# Patient Record
Sex: Female | Born: 1975 | Race: White | Hispanic: No | Marital: Single | Smoking: Never smoker
Health system: Southern US, Community
[De-identification: ages and names within clinical notes are randomized; demographics above are authoritative.]

## PROBLEM LIST (undated history)

## (undated) DIAGNOSIS — E119 Type 2 diabetes mellitus without complications: Secondary | ICD-10-CM

## (undated) DIAGNOSIS — R42 Dizziness and giddiness: Secondary | ICD-10-CM

## (undated) DIAGNOSIS — S060X9A Concussion with loss of consciousness of unspecified duration, initial encounter: Secondary | ICD-10-CM

## (undated) DIAGNOSIS — S060XAA Concussion with loss of consciousness status unknown, initial encounter: Secondary | ICD-10-CM

## (undated) DIAGNOSIS — M549 Dorsalgia, unspecified: Secondary | ICD-10-CM

## (undated) DIAGNOSIS — K5900 Constipation, unspecified: Secondary | ICD-10-CM

## (undated) DIAGNOSIS — F419 Anxiety disorder, unspecified: Secondary | ICD-10-CM

## (undated) DIAGNOSIS — K760 Fatty (change of) liver, not elsewhere classified: Secondary | ICD-10-CM

## (undated) DIAGNOSIS — D649 Anemia, unspecified: Secondary | ICD-10-CM

## (undated) DIAGNOSIS — G4730 Sleep apnea, unspecified: Secondary | ICD-10-CM

## (undated) DIAGNOSIS — F32A Depression, unspecified: Secondary | ICD-10-CM

## (undated) DIAGNOSIS — Z6841 Body Mass Index (BMI) 40.0 and over, adult: Secondary | ICD-10-CM

## (undated) DIAGNOSIS — R002 Palpitations: Secondary | ICD-10-CM

## (undated) DIAGNOSIS — E7800 Pure hypercholesterolemia, unspecified: Secondary | ICD-10-CM

## (undated) DIAGNOSIS — G4700 Insomnia, unspecified: Secondary | ICD-10-CM

## (undated) HISTORY — DX: Dizziness and giddiness: R42

## (undated) HISTORY — DX: Constipation, unspecified: K59.00

## (undated) HISTORY — DX: Concussion with loss of consciousness of unspecified duration, initial encounter: S06.0X9A

## (undated) HISTORY — PX: COLONOSCOPY: SHX174

## (undated) HISTORY — DX: Anemia, unspecified: D64.9

## (undated) HISTORY — DX: Anxiety disorder, unspecified: F41.9

## (undated) HISTORY — DX: Insomnia, unspecified: G47.00

## (undated) HISTORY — DX: Depression, unspecified: F32.A

## (undated) HISTORY — DX: Type 2 diabetes mellitus without complications: E11.9

## (undated) HISTORY — DX: Sleep apnea, unspecified: G47.30

## (undated) HISTORY — DX: Fatty (change of) liver, not elsewhere classified: K76.0

## (undated) HISTORY — DX: Morbid (severe) obesity due to excess calories: E66.01

## (undated) HISTORY — DX: Palpitations: R00.2

## (undated) HISTORY — DX: Concussion with loss of consciousness status unknown, initial encounter: S06.0XAA

## (undated) HISTORY — DX: Pure hypercholesterolemia, unspecified: E78.00

## (undated) HISTORY — DX: Dorsalgia, unspecified: M54.9

## (undated) HISTORY — DX: Body Mass Index (BMI) 40.0 and over, adult: Z684

---

## 2002-09-25 HISTORY — PX: TUBAL LIGATION: SHX77

## 2003-06-09 ENCOUNTER — Emergency Department (HOSPITAL_COMMUNITY): Admission: EM | Admit: 2003-06-09 | Discharge: 2003-06-09 | Payer: Self-pay | Admitting: Emergency Medicine

## 2003-08-17 ENCOUNTER — Emergency Department (HOSPITAL_COMMUNITY): Admission: EM | Admit: 2003-08-17 | Discharge: 2003-08-17 | Payer: Self-pay | Admitting: Emergency Medicine

## 2004-04-04 ENCOUNTER — Emergency Department (HOSPITAL_COMMUNITY): Admission: EM | Admit: 2004-04-04 | Discharge: 2004-04-05 | Payer: Self-pay | Admitting: Emergency Medicine

## 2011-07-20 ENCOUNTER — Encounter: Payer: Self-pay | Admitting: *Deleted

## 2011-07-20 ENCOUNTER — Emergency Department (HOSPITAL_COMMUNITY)
Admission: EM | Admit: 2011-07-20 | Discharge: 2011-07-20 | Disposition: A | Discharge status: Home / Self Care | Payer: Self-pay | Attending: Emergency Medicine | Admitting: Emergency Medicine

## 2011-07-20 DIAGNOSIS — R110 Nausea: Secondary | ICD-10-CM | POA: Insufficient documentation

## 2011-07-20 DIAGNOSIS — R109 Unspecified abdominal pain: Secondary | ICD-10-CM | POA: Insufficient documentation

## 2011-07-20 DIAGNOSIS — M549 Dorsalgia, unspecified: Secondary | ICD-10-CM | POA: Insufficient documentation

## 2011-07-20 DIAGNOSIS — N12 Tubulo-interstitial nephritis, not specified as acute or chronic: Secondary | ICD-10-CM | POA: Insufficient documentation

## 2011-07-20 LAB — URINALYSIS, ROUTINE W REFLEX MICROSCOPIC
Bilirubin Urine: NEGATIVE
Glucose, UA: NEGATIVE mg/dL
Ketones, ur: NEGATIVE mg/dL
Nitrite: NEGATIVE
Protein, ur: NEGATIVE mg/dL
Specific Gravity, Urine: 1.02 (ref 1.005–1.030)
Urobilinogen, UA: 0.2 mg/dL (ref 0.0–1.0)
pH: 6 (ref 5.0–8.0)

## 2011-07-20 LAB — PREGNANCY, URINE: Preg Test, Ur: NEGATIVE

## 2011-07-20 LAB — URINE MICROSCOPIC-ADD ON

## 2011-07-20 MED ORDER — CIPROFLOXACIN HCL 250 MG PO TABS
500.0000 mg | ORAL_TABLET | Freq: Once | ORAL | Status: AC
  Administered 2011-07-20: 500 mg via ORAL
  Filled 2011-07-20: qty 2

## 2011-07-20 MED ORDER — TRAMADOL HCL 50 MG PO TABS: 50.0000 mg | ORAL_TABLET | Freq: Four times a day (QID) | ORAL | Status: AC | PRN

## 2011-07-20 MED ORDER — CIPROFLOXACIN HCL 500 MG PO TABS: 500.0000 mg | ORAL_TABLET | Freq: Two times a day (BID) | ORAL | Status: AC

## 2011-07-20 MED ORDER — KETOROLAC TROMETHAMINE 60 MG/2ML IM SOLN
60.0000 mg | Freq: Once | INTRAMUSCULAR | Status: AC
  Administered 2011-07-20: 60 mg via INTRAMUSCULAR
  Filled 2011-07-20: qty 2

## 2011-07-20 NOTE — ED Provider Notes (Signed)
History     CSN: [id number] Arrival date & time: 07/20/2011  3:02 AM   First MD Initiated Contact with Patient 07/20/11 [phone]      Chief Complaint  Patient presents with  . Flank Pain    (Consider location/radiation/quality/duration/timing/severity/associated sxs/prior treatment) HPI Comments: This pleasant 35 year old female with no significant medical history on no medications who presents with left-sided lower back and flank pain which was gradual in onset 1.5 days ago. This has been constant, gradually getting worse, associated with nausea but no abdominal pain fevers dysuria hematuria frequency urination or diarrhea or constipation. She does not get urinary tract infections frequently and has never had a kidney stone.  Her pain feels achy, sharp and stabbing, does not radiate to her abdomen or her genitalia.  She denies that there is movement related  component of her pain stating that it hurts whether she is standing sitting or lying down.  Patient is a 35 y.o. female presenting with flank pain. The history is provided by the patient.  Flank Pain Pertinent negatives include no chest pain and no abdominal pain.    History reviewed. No pertinent past medical history.  Past Surgical History  Procedure Date  . Tubal ligation     No family history on file.  History  Substance Use Topics  . Smoking status: Never Smoker   . Smokeless tobacco: Not on file  . Alcohol Use: No    OB History    Grav Para Term Preterm Abortions TAB SAB Ect Mult Living                  Review of Systems  Constitutional: Negative for fever and chills.  HENT: Negative for neck pain.   Respiratory: Negative for cough.   Cardiovascular: Negative for chest pain and leg swelling.  Gastrointestinal: Positive for nausea. Negative for vomiting, abdominal pain, diarrhea and constipation.  Genitourinary: Positive for flank pain.  Musculoskeletal: Positive for back pain.  Skin: Negative for rash.    Neurological: Negative for weakness and numbness.  Hematological: Negative for adenopathy.    Allergies  Review of patient's allergies indicates no known allergies.  Home Medications   Current Outpatient Rx  Name Route Sig Dispense Refill  . CIPROFLOXACIN HCL 500 MG PO TABS Oral Take 1 tablet (500 mg total) by mouth every 12 (twelve) hours. 20 tablet 0  . TRAMADOL HCL 50 MG PO TABS Oral Take 1 tablet (50 mg total) by mouth every 6 (six) hours as needed for pain. Maximum dose= 8 tablets per day 15 tablet 0    BP 125/64  Pulse 63  Temp(Src) 97.7 F (36.5 C) (Oral)  Resp 16  Ht 5\' 2"  (1.575 m)  Wt 291 lb (131.997 kg)  BMI 53.22 kg/m2  SpO2 99%  LMP 06/20/2011  Physical Exam  Nursing note and vitals reviewed. Constitutional: She appears well-developed and well-nourished.       Mild discomfort  HENT:  Head: Normocephalic and atraumatic.  Mouth/Throat: Oropharynx is clear and moist. No oropharyngeal exudate.  Eyes: EOM are normal. Right eye exhibits no discharge. Left eye exhibits no discharge. No scleral icterus.  Neck: No JVD present.  Cardiovascular: Normal rate, regular rhythm, normal heart sounds and intact distal pulses.  Exam reveals no gallop and no friction rub.   No murmur heard. Pulmonary/Chest: Effort normal and breath sounds normal. No respiratory distress. She has no wheezes. She has no rales.  Abdominal: Soft. Bowel sounds are normal. She exhibits no distension and  no mass. There is tenderness (Of suprapubic and left lower quadrant tenderness to palpation). There is CVA tenderness.       While the left CVA tenderness  Musculoskeletal: Normal range of motion. She exhibits no edema and no tenderness.  Neurological: She is alert. Coordination normal.  Skin: Skin is warm and dry. No rash noted. No erythema.  Psychiatric: She has a normal mood and affect. Her behavior is normal.    ED Course  Procedures (including critical care time)  Labs Reviewed  URINALYSIS,  ROUTINE W REFLEX MICROSCOPIC - Abnormal; Notable for the following:    Color, Urine STRAW (*)    Appearance HAZY (*)    Hgb urine dipstick LARGE (*)    Leukocytes, UA SMALL (*)    All other components within normal limits  URINE MICROSCOPIC-ADD ON - Abnormal; Notable for the following:    Bacteria, UA FEW (*)    All other components within normal limits  PREGNANCY, URINE  URINE CULTURE   No results found.   1. Pyelonephritis       MDM  Mild suprapubic and left lower quadrant tenderness to palpation. We'll send urinalysis to rule out urinary tract infection. Intramuscular Toradol ordered    Results for orders placed during the hospital encounter of 07/20/11  URINALYSIS, ROUTINE W REFLEX MICROSCOPIC      Component Value Range   Color, Urine STRAW (*) YELLOW    Appearance HAZY (*) CLEAR    Specific Gravity, Urine 1.020  1.005 - 1.030    pH 6.0  5.0 - 8.0    Glucose, UA NEGATIVE  NEGATIVE (mg/dL)   Hgb urine dipstick LARGE (*) NEGATIVE    Bilirubin Urine NEGATIVE  NEGATIVE    Ketones, ur NEGATIVE  NEGATIVE (mg/dL)   Protein, ur NEGATIVE  NEGATIVE (mg/dL)   Urobilinogen, UA 0.2  0.0 - 1.0 (mg/dL)   Nitrite NEGATIVE  NEGATIVE    Leukocytes, UA SMALL (*) NEGATIVE   PREGNANCY, URINE      Component Value Range   Preg Test, Ur NEGATIVE    URINE MICROSCOPIC-ADD ON      Component Value Range   Squamous Epithelial / LPF RARE  RARE    WBC, UA 11-20  <3 (WBC/hpf)   RBC / HPF 21-50  <3 (RBC/hpf)   Bacteria, UA FEW (*) RARE    No results found.  Urinalysis consistent with urinary infection. Given this clinical scenario with left lower back pain this suggests a early pyelonephritis. She is afebrile, normal pulse, normal blood pressure. There is no indication for invasive therapies at this time. Antibiotics given in the emergency department.  I have discussed her findings and treatment plan with her at length and she is expressed her understanding.  Vida Roller, MD 07/20/11  [phone]

## 2011-07-20 NOTE — ED Notes (Signed)
Left sided flank pain x 2 days with nausea.  Denies GU sx.

## 2011-07-21 LAB — URINE CULTURE
Colony Count: NO GROWTH
Culture  Setup Time: 201210251801
Culture: NO GROWTH

## 2012-10-02 ENCOUNTER — Ambulatory Visit: Payer: Self-pay | Admitting: Family Medicine

## 2013-10-22 ENCOUNTER — Ambulatory Visit (INDEPENDENT_AMBULATORY_CARE_PROVIDER_SITE_OTHER): Payer: 59 | Admitting: Women's Health

## 2013-10-22 ENCOUNTER — Encounter: Payer: Self-pay | Admitting: Women's Health

## 2013-10-22 ENCOUNTER — Encounter (INDEPENDENT_AMBULATORY_CARE_PROVIDER_SITE_OTHER): Payer: Self-pay

## 2013-10-22 ENCOUNTER — Other Ambulatory Visit (HOSPITAL_COMMUNITY)
Admission: RE | Admit: 2013-10-22 | Discharge: 2013-10-22 | Disposition: A | Discharge status: Home / Self Care | Payer: 59 | Source: Ambulatory Visit | Attending: Obstetrics & Gynecology | Admitting: Obstetrics & Gynecology

## 2013-10-22 VITALS — BP 100/60 | Ht 62.0 in | Wt 304.0 lb

## 2013-10-22 DIAGNOSIS — Z113 Encounter for screening for infections with a predominantly sexual mode of transmission: Secondary | ICD-10-CM

## 2013-10-22 DIAGNOSIS — F419 Anxiety disorder, unspecified: Secondary | ICD-10-CM | POA: Insufficient documentation

## 2013-10-22 DIAGNOSIS — Z01419 Encounter for gynecological examination (general) (routine) without abnormal findings: Secondary | ICD-10-CM

## 2013-10-22 DIAGNOSIS — G4730 Sleep apnea, unspecified: Secondary | ICD-10-CM | POA: Insufficient documentation

## 2013-10-22 DIAGNOSIS — Z124 Encounter for screening for malignant neoplasm of cervix: Secondary | ICD-10-CM | POA: Insufficient documentation

## 2013-10-22 DIAGNOSIS — R87810 Cervical high risk human papillomavirus (HPV) DNA test positive: Secondary | ICD-10-CM | POA: Insufficient documentation

## 2013-10-22 DIAGNOSIS — Z6841 Body Mass Index (BMI) 40.0 and over, adult: Secondary | ICD-10-CM

## 2013-10-22 DIAGNOSIS — Z1151 Encounter for screening for human papillomavirus (HPV): Secondary | ICD-10-CM | POA: Insufficient documentation

## 2013-10-22 HISTORY — DX: Morbid (severe) obesity due to excess calories: E66.01

## 2013-10-22 LAB — RPR

## 2013-10-22 LAB — HIV ANTIBODY (ROUTINE TESTING W REFLEX): HIV: NONREACTIVE

## 2013-10-22 LAB — HEPATITIS B SURFACE ANTIGEN: Hepatitis B Surface Ag: NEGATIVE

## 2013-10-22 NOTE — Progress Notes (Addendum)
Patient ID: Ana Walker, female   DOB: 25-Mar-1976, 38 y.o.   MRN: [id number] Subjective:     Ana KinKimberly A Walker is a 38 y.o. 682P1011 Caucasian  female here for a routine well-woman exam.  Patient's last menstrual period was 10/07/2013. Started weight watchers program through work in Dec, has lost 13lbs already! [employer] [employer]  Current complaints: none Smoking Status: never Does desire STI screening. Had partner who cheated. Her PCP in [place] does her annual labs, just had all in Sept/Oct and were normal, including A1C and lipid panel.  The following portions of the patient's history were reviewed and updated as appropriate: allergies, current medications, past family history, past medical history, past social history, past surgical history and problem list.  Medical History Anxiety- currently on Pristiq 50mg  daily managed by PCP Sleep apnea- uses CPAP  Gynecologic History Patient's last menstrual period was 10/07/2013. Contraception: tubal ligation Last Pap: unsure. Results were: normal Last mammogram: 1-9012yrs ago. Results were: normal. States she had a lump at that time but it has gone away.   Obstetric History OB History  No data available  G2P1011, term SVD 38yo ago Had A1DM w/ pregnancy  Review of Systems Patient denies any headaches, blurred vision, shortness of breath, chest pain, abdominal pain, problems with bowel movements, urination, or intercourse.    Objective:     Physical Exam  BP 100/60  Ht 5\' 2"  (1.575 m)  Wt 304 lb (137.893 kg)  BMI 55.59 kg/m2  LMP 10/07/2013  General:  Well developed, well nourished, no acute distress. She is alert and oriented x 3. Skin:  Warm and dry Neck:  Midline trachea, no thyromegaly or nodules Cardiovascular: Regular rate and rhythm, no murmur heard Lungs:  Effort normal, all lung fields clear to auscultation bilaterally Breast:  No dominant palpable mass, retraction, or nipple discharge Abdomen:   Soft, non tender, no hepatosplenomegaly or masses Pelvic:  External genitalia is normal in appearance.  The vagina is normal in appearance.  The cervix is anterior, bulbous, no CMT.    Thin prep pap is done w/ HR HPV cotesting. Uterus is felt to be normal size, shape, and contour.  No adnexal masses or   tenderness noted. Extremities:  No swelling or varicosities noted Psych:  She has a normal mood and affect  Assessment:     Healthy well-woman exam Morbid obesity BMI 55.59 STI screening Anxiety Sleep apnea     Plan:  Continue weight watchers/weight loss program HIV, RPR, Hep B&C, HSV2, GC/CH from pap today F/U 6965yr for physical, or sooner prn problems Mammogram @ 40yo or sooner if problems Colonoscopy @ 40yo or sooner if problems   Marge DuncansBooker, Perry Randall CNM, [facility]NP-BC 10/22/2013 9:14 AM

## 2013-10-23 LAB — HSV 2 ANTIBODY, IGG: HSV 2 Glycoprotein G Ab, IgG: 0.11 IV

## 2013-11-12 ENCOUNTER — Ambulatory Visit: Payer: Self-pay | Admitting: Family Medicine

## 2014-02-24 ENCOUNTER — Emergency Department (HOSPITAL_COMMUNITY)
Admission: EM | Admit: 2014-02-24 | Discharge: 2014-02-24 | Disposition: A | Discharge status: Home / Self Care | Payer: 59 | Attending: Emergency Medicine | Admitting: Emergency Medicine

## 2014-02-24 ENCOUNTER — Encounter (HOSPITAL_COMMUNITY): Payer: Self-pay | Admitting: Emergency Medicine

## 2014-02-24 DIAGNOSIS — Y929 Unspecified place or not applicable: Secondary | ICD-10-CM | POA: Insufficient documentation

## 2014-02-24 DIAGNOSIS — T148XXA Other injury of unspecified body region, initial encounter: Secondary | ICD-10-CM

## 2014-02-24 DIAGNOSIS — IMO0002 Reserved for concepts with insufficient information to code with codable children: Secondary | ICD-10-CM | POA: Insufficient documentation

## 2014-02-24 DIAGNOSIS — G4730 Sleep apnea, unspecified: Secondary | ICD-10-CM | POA: Insufficient documentation

## 2014-02-24 DIAGNOSIS — X58XXXA Exposure to other specified factors, initial encounter: Secondary | ICD-10-CM | POA: Insufficient documentation

## 2014-02-24 DIAGNOSIS — F411 Generalized anxiety disorder: Secondary | ICD-10-CM | POA: Insufficient documentation

## 2014-02-24 DIAGNOSIS — Z4889 Encounter for other specified surgical aftercare: Secondary | ICD-10-CM | POA: Insufficient documentation

## 2014-02-24 DIAGNOSIS — Z9981 Dependence on supplemental oxygen: Secondary | ICD-10-CM | POA: Insufficient documentation

## 2014-02-24 DIAGNOSIS — Z79899 Other long term (current) drug therapy: Secondary | ICD-10-CM | POA: Insufficient documentation

## 2014-02-24 DIAGNOSIS — Y9389 Activity, other specified: Secondary | ICD-10-CM | POA: Insufficient documentation

## 2014-02-24 MED ORDER — TRAMADOL HCL 50 MG PO TABS: 50.0000 mg | ORAL_TABLET | Freq: Four times a day (QID) | ORAL | Status: DC | PRN

## 2014-02-24 MED ORDER — CYCLOBENZAPRINE HCL 5 MG PO TABS: 5.0000 mg | ORAL_TABLET | Freq: Two times a day (BID) | ORAL | Status: DC | PRN

## 2014-02-24 NOTE — Discharge Instructions (Signed)
Muscle Strain  A muscle strain (pulled muscle) happens when a muscle is stretched beyond normal length. It happens when a sudden, violent force stretches your muscle too far. Usually, a few of the fibers in your muscle are torn. Muscle strain is common in athletes. Recovery usually takes 1 2 weeks. Complete healing takes 5 6 weeks.   HOME CARE    Follow the PRICE method of treatment to help your injury get better. Do this the first 2 3 days after the injury:   Protect. Protect the muscle to keep it from getting injured again.   Rest. Limit your activity and rest the injured body part.   Ice. Put ice in a plastic bag. Place a towel between your skin and the bag. Then, apply the ice and leave it on from 15 20 minutes each hour. After the third day, switch to moist heat packs.   Compression. Use a splint or elastic bandage on the injured area for comfort. Do not put it on too tightly.   Elevate. Keep the injured body part above the level of your heart.   Only take medicine as told by your doctor.   Warm up before doing exercise to prevent future muscle strains.  GET HELP IF:    You have more pain or puffiness (swelling) in the injured area.   You feel numbness, tingling, or notice a loss of strength in the injured area.  MAKE SURE YOU:    Understand these instructions.   Will watch your condition.   Will get help right away if you are not doing well or get worse.  Document Released: 06/20/2008 Document Revised: 07/02/2013 Document Reviewed: 04/10/2013  [employer] Patient Information 2014 [employer].

## 2014-02-24 NOTE — ED Notes (Signed)
Rt thigh pain, onset Saturday , after pushing a tv on rollers.

## 2014-02-24 NOTE — ED Provider Notes (Signed)
Medical screening examination/treatment/procedure(s) were performed by non-physician practitioner and as supervising physician I was immediately available for consultation/collaboration.   EKG Interpretation None       Honor Fairbank R. Jameson Morrow, MD 02/24/14 1528 

## 2014-02-24 NOTE — ED Provider Notes (Signed)
CSN: [id number]     Arrival date & time 02/24/14  1403 History   First MD Initiated Contact with Patient 02/24/14 1410     Chief Complaint  Patient presents with  . Leg Pain     (Consider location/radiation/quality/duration/timing/severity/associated sxs/prior Treatment) HPI Comments: Pt states that she moved a old large tv on rollers and her back started hurting the next day. The pain started 4 days ago. The pain radiates down her right leg. Denies numbness, weakness or dysuria. States that she has taking ibuprofen with some mild relief. No history of back pain  The history is provided by the patient. No language interpreter was used.    Past Medical History  Diagnosis Date  . Sleep apnea   . Sleep apnea     uses cpap  . Anxiety     on pristiq 50mg  daily  . Morbid obesity with BMI of 50.0-59.9, adult 10/22/2013  . Anxiety    Past Surgical History  Procedure Laterality Date  . Tubal ligation     History reviewed. No pertinent family history. History  Substance Use Topics  . Smoking status: Never Smoker   . Smokeless tobacco: Not on file  . Alcohol Use: No   OB History   Grav Para Term Preterm Abortions TAB SAB Ect Mult Living                 Review of Systems  Constitutional: Negative.   Respiratory: Negative.   Cardiovascular: Negative.       Allergies  Review of patient's allergies indicates no known allergies.  Home Medications   Prior to Admission medications   Medication Sig Start Date End Date Taking? Authorizing Provider  desvenlafaxine (PRISTIQ) 50 MG 24 hr tablet Take 50 mg by mouth daily.    Historical Provider, MD   BP 130/75  Pulse 80  Temp(Src) 98.4 F (36.9 C) (Oral)  Resp 18  Ht 5\' 2"  (1.575 m)  Wt 305 lb (138.347 kg)  BMI 55.77 kg/m2  SpO2 98%  LMP 02/03/2014 Physical Exam  Nursing note and vitals reviewed. Constitutional: She is oriented to person, place, and time. She appears well-developed and well-nourished.  Cardiovascular:  Normal rate and regular rhythm.   Pulmonary/Chest: Effort normal and breath sounds normal.  Musculoskeletal:  Lumbar paraspinal tenderness. Pt has rull rom of the right hip. No redness or warmth noted  Neurological: She is alert and oriented to person, place, and time. Coordination normal.  Skin: Skin is warm and dry.  Psychiatric: She has a normal mood and affect.    ED Course  Procedures (including critical care time) Labs Review Labs Reviewed - No data to display  Imaging Review No results found.   EKG Interpretation None      MDM   Final diagnoses:  Muscle strain    Pt is neurovascularly intact. Likely strain. Don't think imaging is needed at this time will treat symptomatically with ultram and flexeril    Teressa Lower, NP 02/24/14 1428

## 2014-07-10 ENCOUNTER — Other Ambulatory Visit: Payer: Self-pay

## 2014-09-26 ENCOUNTER — Emergency Department (HOSPITAL_COMMUNITY): Payer: 59

## 2014-09-26 ENCOUNTER — Encounter (HOSPITAL_COMMUNITY): Payer: Self-pay

## 2014-09-26 ENCOUNTER — Emergency Department (HOSPITAL_COMMUNITY)
Admission: EM | Admit: 2014-09-26 | Discharge: 2014-09-26 | Disposition: A | Discharge status: Home / Self Care | Payer: 59 | Attending: Emergency Medicine | Admitting: Emergency Medicine

## 2014-09-26 DIAGNOSIS — Z792 Long term (current) use of antibiotics: Secondary | ICD-10-CM | POA: Insufficient documentation

## 2014-09-26 DIAGNOSIS — J209 Acute bronchitis, unspecified: Secondary | ICD-10-CM | POA: Insufficient documentation

## 2014-09-26 DIAGNOSIS — Z9981 Dependence on supplemental oxygen: Secondary | ICD-10-CM | POA: Insufficient documentation

## 2014-09-26 DIAGNOSIS — Z79899 Other long term (current) drug therapy: Secondary | ICD-10-CM | POA: Insufficient documentation

## 2014-09-26 DIAGNOSIS — G4730 Sleep apnea, unspecified: Secondary | ICD-10-CM | POA: Insufficient documentation

## 2014-09-26 DIAGNOSIS — R059 Cough, unspecified: Secondary | ICD-10-CM

## 2014-09-26 DIAGNOSIS — R05 Cough: Secondary | ICD-10-CM

## 2014-09-26 DIAGNOSIS — F419 Anxiety disorder, unspecified: Secondary | ICD-10-CM | POA: Insufficient documentation

## 2014-09-26 MED ORDER — AZITHROMYCIN 250 MG PO TABS
500.0000 mg | ORAL_TABLET | Freq: Once | ORAL | Status: AC
  Administered 2014-09-26: 500 mg via ORAL
  Filled 2014-09-26: qty 2

## 2014-09-26 MED ORDER — AZITHROMYCIN 250 MG PO TABS: ORAL_TABLET | ORAL | Status: DC

## 2014-09-26 MED ORDER — BENZONATATE 100 MG PO CAPS: 200.0000 mg | ORAL_CAPSULE | Freq: Three times a day (TID) | ORAL | Status: DC

## 2014-09-26 MED ORDER — ALBUTEROL SULFATE HFA 108 (90 BASE) MCG/ACT IN AERS
2.0000 | INHALATION_SPRAY | RESPIRATORY_TRACT | Status: DC | PRN
  Administered 2014-09-26: 2 via RESPIRATORY_TRACT
  Filled 2014-09-26: qty 6.7

## 2014-09-26 NOTE — ED Provider Notes (Signed)
CSN: [id number]     Arrival date & time 09/26/14  1123 History   First MD Initiated Contact with Patient 09/26/14 1336     Chief Complaint  Patient presents with  . Cough     (Consider location/radiation/quality/duration/timing/severity/associated sxs/prior Treatment) The history is provided by the patient.   Ana Walker is a 39 y.o. female presenting with a one week history of uri type symptoms which includes nasal congestion with clear rhinorrhea, cough which is occasionally productive of a thick sputum (pt has not looked, cannot confirm color or lack of blood), along with wheezing and shortness of breath with wheezing episodes.  She denies fevers, chills, chest pain.  She does have sleep apnea, currently using cpap machine nightly and uses singulair daily.  She does not have a history of asthma.   She denies nausea, vomiting or diarrhea.  The patient has taken delsym, dayquil, nyquill with transient relief of symptoms.     Past Medical History  Diagnosis Date  . Sleep apnea   . Sleep apnea     uses cpap  . Anxiety     on pristiq  daily  . Morbid obesity with BMI of 50.0-59.9, adult 10/22/2013  . Anxiety    Past Surgical History  Procedure Laterality Date  . Tubal ligation     No family history on file. History  Substance Use Topics  . Smoking status: Never Smoker   . Smokeless tobacco: Not on file  . Alcohol Use: No   OB History    No data available     Review of Systems  Constitutional: Negative for fever and chills.  HENT: Positive for congestion and rhinorrhea. Negative for ear pain, sinus pressure, sore throat, trouble swallowing and voice change.   Eyes: Negative for discharge.  Respiratory: Positive for cough and wheezing. Negative for shortness of breath and stridor.   Cardiovascular: Negative for chest pain.  Gastrointestinal: Negative for abdominal pain.  Genitourinary: Negative.       Allergies  Review of patient's allergies indicates no  known allergies.  Home Medications   Prior to Admission medications   Medication Sig Start Date End Date Taking? Authorizing Provider  ALPRAZolam Prudy Feeler) 0.5 MG tablet Take 0.5 mg by mouth daily as needed for anxiety.   Yes Historical Provider, MD  ferrous sulfate 325 (65 FE) MG tablet Take 325 mg by mouth daily with breakfast.   Yes Historical Provider, MD  FLUoxetine (PROZAC) 40 MG capsule Take 40 mg by mouth daily.   Yes Historical Provider, MD  montelukast (SINGULAIR) 10 MG tablet Take 10 mg by mouth daily.   Yes Historical Provider, MD  azithromycin (ZITHROMAX) 250 MG tablet One tablet daily x 4 days 09/26/14   Burgess Amor, PA-C  benzonatate (TESSALON) 100 MG capsule Take 2 capsules (200 mg total) by mouth every 8 (eight) hours. 09/26/14   Burgess Amor, PA-C  cyclobenzaprine (FLEXERIL) 5 MG tablet Take 1 tablet (5 mg total) by mouth 2 (two) times daily as needed for muscle spasms. Patient not taking: Reported on 09/26/2014 02/24/14   Teressa Lower, NP  traMADol (ULTRAM) 50 MG tablet Take 1 tablet (50 mg total) by mouth every 6 (six) hours as needed. Patient not taking: Reported on 09/26/2014 02/24/14   Teressa Lower, NP   BP 125/69 mmHg  Pulse 67  Temp(Src) 98.1 F (36.7 C) (Oral)  Resp 16  Ht  (1.575 m)  Wt 310 lb (140.615 kg)  BMI 56.69 kg/m2  SpO2 100%  LMP 09/14/2014 Physical Exam  Constitutional: She appears well-developed and well-nourished.  HENT:  Head: Normocephalic and atraumatic.  Eyes: Conjunctivae are normal.  Neck: Normal range of motion.  Cardiovascular: Normal rate, regular rhythm, normal heart sounds and intact distal pulses.   Pulmonary/Chest: Effort normal and breath sounds normal. No respiratory distress. She has no wheezes. She has no rales. She exhibits no tenderness.  occasional dry cough during exam.  No wheezing on exam  Abdominal: Soft. Bowel sounds are normal. There is no tenderness.  Musculoskeletal: Normal range of motion.  Neurological: She is  alert.  Skin: Skin is warm and dry.  Psychiatric: She has a normal mood and affect.  Nursing note and vitals reviewed.   ED Course  Procedures (including critical care time) Labs Review Labs Reviewed - No data to display  Imaging Review Dg Chest 2 View  09/26/2014   CLINICAL DATA:  Cough, congestion and shortness of breath.  EXAM: CHEST - 2 VIEW  COMPARISON:  07/20/2006 film at [facility]  FINDINGS: The heart size and mediastinal contours are within normal limits. Mild atelectasis present at both lung bases. There is no evidence of pulmonary edema, consolidation, pneumothorax, nodule or pleural fluid. The visualized skeletal structures are unremarkable.  IMPRESSION: No active disease.   Electronically Signed   By: Irish Lack M.D.   On: 09/26/2014 12:57     EKG Interpretation None      MDM   Final diagnoses:  Acute bronchitis, unspecified organism    Patients labs and/or radiological studies were viewed and considered during the medical decision making and disposition process. Chest x-ray is unremarkable, however given patient's history of sleep apnea will cover her with antibiotics, Z-Pak prescribed.  She was also prescribed Tessalon pearls for cough, given albuterol HFA for every 4 hours when necessary use.  Advised increase fluid intake, rest, follow-up with PCP or return here for any worsening symptoms.  She is not in any respiratory distress at this time.  The patient appears reasonably screened and/or stabilized for discharge and I doubt any other medical condition or other [facility] requiring further screening, evaluation, or treatment in the ED at this time prior to discharge.     Burgess Amor, PA-C 09/26/14 1436  Doug Sou, MD 09/26/14 1558

## 2014-09-26 NOTE — Discharge Instructions (Signed)
Acute Bronchitis Bronchitis is inflammation of the airways that extend from the windpipe into the lungs (bronchi). The inflammation often causes mucus to develop. This leads to a cough, which is the most common symptom of bronchitis.  In acute bronchitis, the condition usually develops suddenly and goes away over time, usually in a couple weeks. Smoking, allergies, and asthma can make bronchitis worse. Repeated episodes of bronchitis may cause further lung problems.  CAUSES Acute bronchitis is most often caused by the same virus that causes a cold. The virus can spread from person to person (contagious) through coughing, sneezing, and touching contaminated objects. SIGNS AND SYMPTOMS   Cough.   Fever.   Coughing up mucus.   Body aches.   Chest congestion.   Chills.   Shortness of breath.   Sore throat.  DIAGNOSIS  Acute bronchitis is usually diagnosed through a physical exam. Your health care provider will also ask you questions about your medical history. Tests, such as chest X-rays, are sometimes done to rule out other conditions.  TREATMENT  Acute bronchitis usually goes away in a couple weeks. Oftentimes, no medical treatment is necessary. Medicines are sometimes given for relief of fever or cough. Antibiotic medicines are usually not needed but may be prescribed in certain situations. In some cases, an inhaler may be recommended to help reduce shortness of breath and control the cough. A cool mist vaporizer may also be used to help thin bronchial secretions and make it easier to clear the chest.  HOME CARE INSTRUCTIONS  Get plenty of rest.   Drink enough fluids to keep your urine clear or pale yellow (unless you have a medical condition that requires fluid restriction). Increasing fluids may help thin your respiratory secretions (sputum) and reduce chest congestion, and it will prevent dehydration.   Take medicines only as directed by your health care provider.  If  you were prescribed an antibiotic medicine, finish it all even if you start to feel better.  Avoid smoking and secondhand smoke. Exposure to cigarette smoke or irritating chemicals will make bronchitis worse. If you are a smoker, consider using nicotine gum or skin patches to help control withdrawal symptoms. Quitting smoking will help your lungs heal faster.   Reduce the chances of another bout of acute bronchitis by washing your hands frequently, avoiding people with cold symptoms, and trying not to touch your hands to your mouth, nose, or eyes.   Keep all follow-up visits as directed by your health care provider.  SEEK MEDICAL CARE IF: Your symptoms do not improve after 1 week of treatment.  SEEK IMMEDIATE MEDICAL CARE IF:  You develop an increased fever or chills.   You have chest pain.   You have severe shortness of breath.  You have bloody sputum.   You develop dehydration.  You faint or repeatedly feel like you are going to pass out.  You develop repeated vomiting.  You develop a severe headache. MAKE SURE YOU:   Understand these instructions.  Will watch your condition.  Will get help right away if you are not doing well or get worse. Document Released: 10/19/2004 Document Revised: 01/26/2014 Document Reviewed: 03/04/2013 [facility] Patient Information 2015 [place], [place]. This information is not intended to replace advice given to you by your health care provider. Make sure you discuss any questions you have with your health care provider.   Take your next dose of Zithromax tomorrow.  Use the inhaler as instructed.  Rest and make sure you are drinking plenty  of fluids.

## 2014-09-26 NOTE — ED Notes (Signed)
Complain of cough and congestion 

## 2014-12-15 ENCOUNTER — Encounter (HOSPITAL_COMMUNITY): Payer: Self-pay | Admitting: *Deleted

## 2014-12-15 ENCOUNTER — Emergency Department (HOSPITAL_COMMUNITY): Payer: Self-pay

## 2014-12-15 ENCOUNTER — Emergency Department (HOSPITAL_COMMUNITY)
Admission: EM | Admit: 2014-12-15 | Discharge: 2014-12-15 | Disposition: A | Discharge status: Home / Self Care | Payer: Self-pay | Attending: Emergency Medicine | Admitting: Emergency Medicine

## 2014-12-15 DIAGNOSIS — Y9389 Activity, other specified: Secondary | ICD-10-CM | POA: Insufficient documentation

## 2014-12-15 DIAGNOSIS — Y9289 Other specified places as the place of occurrence of the external cause: Secondary | ICD-10-CM | POA: Insufficient documentation

## 2014-12-15 DIAGNOSIS — S300XXA Contusion of lower back and pelvis, initial encounter: Secondary | ICD-10-CM

## 2014-12-15 DIAGNOSIS — Z79899 Other long term (current) drug therapy: Secondary | ICD-10-CM | POA: Insufficient documentation

## 2014-12-15 DIAGNOSIS — Y998 Other external cause status: Secondary | ICD-10-CM | POA: Insufficient documentation

## 2014-12-15 DIAGNOSIS — W108XXA Fall (on) (from) other stairs and steps, initial encounter: Secondary | ICD-10-CM | POA: Insufficient documentation

## 2014-12-15 DIAGNOSIS — G4730 Sleep apnea, unspecified: Secondary | ICD-10-CM | POA: Insufficient documentation

## 2014-12-15 DIAGNOSIS — F419 Anxiety disorder, unspecified: Secondary | ICD-10-CM | POA: Insufficient documentation

## 2014-12-15 DIAGNOSIS — Z9981 Dependence on supplemental oxygen: Secondary | ICD-10-CM | POA: Insufficient documentation

## 2014-12-15 MED ORDER — TRAMADOL HCL 50 MG PO TABS: 50.0000 mg | ORAL_TABLET | Freq: Four times a day (QID) | ORAL | Status: DC | PRN

## 2014-12-15 MED ORDER — KETOROLAC TROMETHAMINE 60 MG/2ML IM SOLN
60.0000 mg | Freq: Once | INTRAMUSCULAR | Status: AC
  Administered 2014-12-15: 60 mg via INTRAMUSCULAR
  Filled 2014-12-15: qty 2

## 2014-12-15 MED ORDER — CYCLOBENZAPRINE HCL 10 MG PO TABS: 10.0000 mg | ORAL_TABLET | Freq: Three times a day (TID) | ORAL | Status: DC | PRN

## 2014-12-15 NOTE — ED Notes (Signed)
Pt alert & oriented x4, stable gait. Patient given discharge instructions, paperwork & prescription(s). Patient  instructed to stop at the registration desk to finish any additional paperwork. Patient verbalized understanding. Pt left department w/ no further questions. 

## 2014-12-15 NOTE — ED Notes (Signed)
Pt states she missed a step twisted her ankle & hit her back, wearing a brace. Ankle better but today when she was able to stand more upright started having sharp pain in her lower back. Slight abrasion noted to back.

## 2014-12-15 NOTE — ED Notes (Addendum)
Lower back pan and lt ankle pain, fell on Saturday,

## 2014-12-16 NOTE — ED Provider Notes (Signed)
CSN: [id number]     Arrival date & time 12/15/14  1913 History   First MD Initiated Contact with Patient 12/15/14 2001     Chief Complaint  Patient presents with  . Fall     (Consider location/radiation/quality/duration/timing/severity/associated sxs/prior Treatment) HPI   Ana Walker is a 39 y.o. female who presents to the Emergency Department complaining of low back pain after a fall.  She states that she fell down some steps three days ago, striking her back against a door.  She also reports pain to her left ankle that seem to be improving.  She has been taking ibuprofen for her pain without relief.  She describes a dull pain to her lower back that is worse with standing and bending, improves at rest.  She also notes a small area of bruising to her lower back.  She denies numbness or weakness of the lower extremities, abd pain, urine or bowel changes, head injury or LOC.       Past Medical History  Diagnosis Date  . Sleep apnea   . Sleep apnea     uses cpap  . Anxiety     on pristiq  daily  . Morbid obesity with BMI of 50.0-59.9, adult 10/22/2013  . Anxiety    Past Surgical History  Procedure Laterality Date  . Tubal ligation     History reviewed. No pertinent family history. History  Substance Use Topics  . Smoking status: Never Smoker   . Smokeless tobacco: Not on file  . Alcohol Use: No   OB History    No data available     Review of Systems  Constitutional: Negative for fever.  Respiratory: Negative for shortness of breath.   Gastrointestinal: Negative for vomiting, abdominal pain and constipation.  Genitourinary: Negative for dysuria, hematuria, flank pain, decreased urine volume and difficulty urinating.  Musculoskeletal: Positive for back pain. Negative for joint swelling.  Skin: Negative for rash.  Neurological: Negative for weakness and numbness.  All other systems reviewed and are negative.     Allergies  Review of patient's allergies  indicates no known allergies.  Home Medications   Prior to Admission medications   Medication Sig Start Date End Date Taking? Authorizing Provider  ALPRAZolam Prudy Feeler) 0.5 MG tablet Take 0.5 mg by mouth daily as needed for anxiety.   Yes Historical Provider, MD  ferrous sulfate 325 (65 FE) MG tablet Take 325 mg by mouth daily with breakfast.   Yes Historical Provider, MD  FLUoxetine (PROZAC) 40 MG capsule Take 40 mg by mouth daily.   Yes Historical Provider, MD  montelukast (SINGULAIR) 10 MG tablet Take 10 mg by mouth daily.   Yes Historical Provider, MD  cyclobenzaprine (FLEXERIL) 10 MG tablet Take 1 tablet (10 mg total) by mouth 3 (three) times daily as needed. 12/15/14   Tammi Jatin Naumann, PA-C  traMADol (ULTRAM) 50 MG tablet Take 1 tablet (50 mg total) by mouth every 6 (six) hours as needed. 12/15/14   Tammi Bladyn Tipps, PA-C   BP 132/67 mmHg  Pulse 69  Temp(Src) 98.2 F (36.8 C) (Oral)  Resp 22  Ht  (1.575 m)  Wt 305 lb (138.347 kg)  BMI 55.77 kg/m2  SpO2 100%  LMP 12/12/2014 Physical Exam  Constitutional: She is oriented to person, place, and time. She appears well-developed and well-nourished. No distress.  HENT:  Head: Normocephalic and atraumatic.  Neck: Normal range of motion. Neck supple.  Cardiovascular: Normal rate, regular rhythm, normal heart sounds and intact  distal pulses.   No murmur heard. Pulmonary/Chest: Effort normal and breath sounds normal. No respiratory distress.  Abdominal: Soft. She exhibits no distension. There is no tenderness.  Musculoskeletal: She exhibits tenderness. She exhibits no edema.       Lumbar back: She exhibits tenderness and pain. She exhibits normal range of motion, no swelling, no deformity, no laceration and normal pulse.  ttp of the lower lumbar spine and bilateral paraspinal muscles. DP pulses are brisk and symmetrical.  Distal sensation intact.  Hip Flexors/Extensors are intact.  Pt has 5/5 strength against resistance of bilateral lower  extremities.  No obvious ecchymosis or abrasions to the back.  No edema or bony deformity of the left ankle.  No proximal tenderness, compartments soft   Neurological: She is alert and oriented to person, place, and time. She has normal strength. No sensory deficit. She exhibits normal muscle tone. Coordination and gait normal.  Reflex Scores:      Patellar reflexes are 2+ on the right side and 2+ on the left side.      Achilles reflexes are 2+ on the right side and 2+ on the left side. Skin: Skin is warm and dry. No rash noted.  Nursing note and vitals reviewed.   ED Course  Procedures (including critical care time) Labs Review Labs Reviewed - No data to display  Imaging Review Dg Lumbar Spine Complete  12/15/2014   CLINICAL DATA:  Back pain after fall 3 days prior. Hit back on a door. Lumbar back pain radiating into left leg.  EXAM: LUMBAR SPINE - COMPLETE 4+ VIEW  COMPARISON:  None.  FINDINGS: The alignment is maintained. Vertebral body heights are normal. There is no listhesis. The posterior elements are intact. Disc spaces are preserved. Mild endplate spurring is seen throughout. No fracture. Sacroiliac joints are symmetric and normal.  IMPRESSION: No fracture or subluxation of the lumbar spine.  Mild spondylosis.   Electronically Signed   By: Rubye OaksMelanie  Ehinger M.D.   On: 12/15/2014 21:34     EKG Interpretation None      MDM   Final diagnoses:  Lumbar contusion, initial encounter   Pt is ambulatory, no focal neuro deficits.  No concerning sx's for emergent neurological or infectious process.  Likely musculoskeletal injury, pain improved after IM Toradol.  Pt is currently wearing an ankle brace.  She appears stable for d/c and referral given for ortho, rx for flexeril and ultram.       Severiano Gilbertammi Kehinde Bowdish, PA-C 12/16/14 0027  Layla MawKristen N Ward, DO 12/16/14 [phone]

## 2015-02-26 DIAGNOSIS — R079 Chest pain, unspecified: Secondary | ICD-10-CM | POA: Insufficient documentation

## 2015-02-26 DIAGNOSIS — Z79899 Other long term (current) drug therapy: Secondary | ICD-10-CM | POA: Insufficient documentation

## 2015-02-26 DIAGNOSIS — G4730 Sleep apnea, unspecified: Secondary | ICD-10-CM | POA: Insufficient documentation

## 2015-02-26 DIAGNOSIS — Z9981 Dependence on supplemental oxygen: Secondary | ICD-10-CM | POA: Insufficient documentation

## 2015-02-26 DIAGNOSIS — H00015 Hordeolum externum left lower eyelid: Secondary | ICD-10-CM | POA: Insufficient documentation

## 2015-02-26 DIAGNOSIS — F419 Anxiety disorder, unspecified: Secondary | ICD-10-CM | POA: Insufficient documentation

## 2015-02-27 ENCOUNTER — Encounter (HOSPITAL_COMMUNITY): Payer: Self-pay | Admitting: Emergency Medicine

## 2015-02-27 ENCOUNTER — Emergency Department (HOSPITAL_COMMUNITY): Payer: Self-pay

## 2015-02-27 ENCOUNTER — Emergency Department (HOSPITAL_COMMUNITY)
Admission: EM | Admit: 2015-02-27 | Discharge: 2015-02-27 | Disposition: A | Discharge status: Home / Self Care | Payer: Self-pay | Attending: Emergency Medicine | Admitting: Emergency Medicine

## 2015-02-27 DIAGNOSIS — R079 Chest pain, unspecified: Secondary | ICD-10-CM

## 2015-02-27 DIAGNOSIS — H00016 Hordeolum externum left eye, unspecified eyelid: Secondary | ICD-10-CM

## 2015-02-27 LAB — CBC
HCT: 34.5 % — ABNORMAL LOW (ref 36.0–46.0)
Hemoglobin: 10.9 g/dL — ABNORMAL LOW (ref 12.0–15.0)
MCH: 28.9 pg (ref 26.0–34.0)
MCHC: 31.6 g/dL (ref 30.0–36.0)
MCV: 91.5 fL (ref 78.0–100.0)
Platelets: 202 10*3/uL (ref 150–400)
RBC: 3.77 MIL/uL — ABNORMAL LOW (ref 3.87–5.11)
RDW: 13.7 % (ref 11.5–15.5)
WBC: 8.1 10*3/uL (ref 4.0–10.5)

## 2015-02-27 LAB — BASIC METABOLIC PANEL
Anion gap: 8 (ref 5–15)
BUN: 12 mg/dL (ref 6–20)
CO2: 26 mmol/L (ref 22–32)
Calcium: 8.2 mg/dL — ABNORMAL LOW (ref 8.9–10.3)
Chloride: 105 mmol/L (ref 101–111)
Creatinine, Ser: 0.7 mg/dL (ref 0.44–1.00)
GFR calc Af Amer: >60 mL/min (ref >60)
GFR calc non Af Amer: >60 mL/min (ref >60)
Glucose, Bld: 126 mg/dL — ABNORMAL HIGH (ref 65–99)
Potassium: 3.6 mmol/L (ref 3.5–5.1)
Sodium: 139 mmol/L (ref 135–145)

## 2015-02-27 LAB — TROPONIN I: Troponin I: <0.03 ng/mL (ref <0.031)

## 2015-02-27 NOTE — ED Provider Notes (Signed)
This chart was scribed for Ana MawKristen N Suraj Ramdass, DO by Roxy Cedarhandni Bhalodia, ED Scribe. This patient was seen in room APA01/AP[facility]01 and the patient's care was started at 12:39 AM.  TIME SEEN: 12:39 AM   CHIEF COMPLAINT: Chest pain; Facial pain  HPI: Ana Walker is a 39 y.o. female with history of sleep apnea, obesity, anxiety who presents to the Emergency Department complaining of moderate chest palpitations onset 9PM 02/25/15. Patient states she took aspirin. She went to work yesterday and had onset of sharp chest pain that lasted about 15 seconds. Her BP measured 142/80, HR 90 with associated diaphoresis. She took nitroglycerin, waited 6 minutes and took another nitroglycerin which provided some relief. Patient also reports pain to left eye yesterday. She states that her left eye was swollen when she woke up. She currently complains of SOB. Patient is not a smoker. She denies prior hx of PE or DVT. She denies recent surgery, recent travel, hospitalization, trauma. No exogenous estrogen use.  ROS: See HPI Constitutional: no fever  Eyes: no drainage  ENT: no runny nose   Cardiovascular:   chest pain  Resp:  SOB  GI: no vomiting GU: no dysuria Integumentary: no rash  Allergy: no hives  Musculoskeletal: no leg swelling  Neurological: no slurred speech ROS otherwise negative  PAST MEDICAL HISTORY/PAST SURGICAL HISTORY:  Past Medical History  Diagnosis Date  . Sleep apnea   . Sleep apnea     uses cpap  . Anxiety     on pristiq 50mg  daily  . Morbid obesity with BMI of 50.0-59.9, adult 10/22/2013  . Anxiety     MEDICATIONS:  Prior to Admission medications   Medication Sig Start Date End Date Taking? Authorizing Provider  ALPRAZolam Prudy Feeler(XANAX) 0.5 MG tablet Take 0.5 mg by mouth daily as needed for anxiety.    Historical Provider, MD  cyclobenzaprine (FLEXERIL) 10 MG tablet Take 1 tablet (10 mg total) by mouth 3 (three) times daily as needed. 12/15/14   Tammy Triplett, PA-C  ferrous sulfate 325  (65 FE) MG tablet Take 325 mg by mouth daily with breakfast.    Historical Provider, MD  FLUoxetine (PROZAC) 40 MG capsule Take 40 mg by mouth daily.    Historical Provider, MD  montelukast (SINGULAIR) 10 MG tablet Take 10 mg by mouth daily.    Historical Provider, MD  traMADol (ULTRAM) 50 MG tablet Take 1 tablet (50 mg total) by mouth every 6 (six) hours as needed. 12/15/14   Tammy Triplett, PA-C    ALLERGIES:  No Known Allergies  SOCIAL HISTORY:  History  Substance Use Topics  . Smoking status: Never Smoker   . Smokeless tobacco: Not on file  . Alcohol Use: No    FAMILY HISTORY: History reviewed. No pertinent family history.  EXAM: Triage Vitals: BP 134/73 mmHg  Pulse 68  Temp(Src) 98 F (36.7 C) (Oral)  Resp 22  Ht 5\' 2"  (1.575 m)  Wt 320 lb (145.151 kg)  BMI 58.51 kg/m2  SpO2 100%  LMP 02/09/2015  CONSTITUTIONAL: Alert and oriented and responds appropriately to questions. Well-appearing; well-nourished HEAD: Normocephalic EYES: Conjunctivae clear, PERRL, small stye to left inner lower lid. No eye drainage. Extraocular movements intact. No hyphema or hypopion. ENT: normal nose; no rhinorrhea; moist mucous membranes; pharynx without lesions noted. No dental abscess or dental carries. No trismus or drooling. No tonsillar hypertrophy or exudates. No facial swelling, erythema or warmth. NECK: Supple, no meningismus, no LAD  CARD: RRR; S1 and S2 appreciated; no  murmurs, no clicks, no rubs, no gallops RESP: Normal chest excursion without splinting or tachypnea; breath sounds clear and equal bilaterally; no wheezes, no rhonchi, no rales, no hypoxia or respiratory distress, speaking full sentences ABD/GI: Normal bowel sounds; non-distended; soft, non-tender, no rebound, no guarding, no peritoneal signs BACK:  The back appears normal and is non-tender to palpation, there is no CVA tenderness EXT: Normal ROM in all joints; non-tender to palpation; no edema; normal capillary refill;  no cyanosis, no calf tenderness or swelling    SKIN: Normal color for age and race; warm NEURO: Moves all extremities equally, sensation to light touch intact diffusely, cranial nerves II through XII intact PSYCH: The patient's mood and manner are appropriate. Grooming and personal hygiene are appropriate.  MEDICAL DECISION MAKING: Patient here with episode of chest pain and palpitations that occurred over 24 hours ago. She did have some shortness of breath today. No risk factors for ACS other than obesity. EKG shows no ischemic changes, arrhythmia or interval changes. We'll obtain cardiac labs, chest x-ray.  ED PROGRESS: Patient's labs are unremarkable. Troponin negative. Chest x-ray clear. I feel she is safe to be discharged home. She is asymptomatic. Hemodynamically stable. Have advised her to follow-up with her primary care physician. Discussed return precautions. Discussed using warm compresses for the small stye in the left lower inner lid. She verbalized understanding and discomfort with this plan.    EKG Interpretation  Date/Time:  Saturday February 27 2015 00:18:22 EDT Ventricular Rate:  55 PR Interval:  148 QRS Duration: 86 QT Interval:  460 QTC Calculation: 440 R Axis:   32 Text Interpretation:  Sinus bradycardia Otherwise normal ECG No old tracing to compare Confirmed by Ana Haug,  DO, Ana Walker ([id number]) on 02/27/2015 12:46:44 AM         I personally performed the services described in this documentation, which was scribed in my presence. The recorded information has been reviewed and is accurate.   Ana Maw [name], DO 02/27/15 [phone]

## 2015-02-27 NOTE — Discharge Instructions (Signed)
Chest Pain (Nonspecific) °It is often hard to give a specific diagnosis for the cause of chest pain. There is always a chance that your pain could be related to something serious, such as a heart attack or a blood clot in the lungs. You need to follow up with your health care provider for further evaluation. °CAUSES  °· Heartburn. °· Pneumonia or bronchitis. °· Anxiety or stress. °· Inflammation around your heart (pericarditis) or lung (pleuritis or pleurisy). °· A blood clot in the lung. °· A collapsed lung (pneumothorax). It can develop suddenly on its own (spontaneous pneumothorax) or from trauma to the chest. °· Shingles infection (herpes zoster virus). °The chest wall is composed of bones, muscles, and cartilage. Any of these can be the source of the pain. °· The bones can be bruised by injury. °· The muscles or cartilage can be strained by coughing or overwork. °· The cartilage can be affected by inflammation and become sore (costochondritis). °DIAGNOSIS  °Lab tests or other studies may be needed to find the cause of your pain. Your health care provider may have you take a test called an ambulatory electrocardiogram (ECG). An ECG records your heartbeat patterns over a 24-hour period. You may also have other tests, such as: °· Transthoracic echocardiogram (TTE). During echocardiography, sound waves are used to evaluate how blood flows through your heart. °· Transesophageal echocardiogram (TEE). °· Cardiac monitoring. This allows your health care provider to monitor your heart rate and rhythm in real time. °· Holter monitor. This is a portable device that records your heartbeat and can help diagnose heart arrhythmias. It allows your health care provider to track your heart activity for several days, if needed. °· Stress tests by exercise or by giving medicine that makes the heart beat faster. °TREATMENT  °· Treatment depends on what may be causing your chest pain. Treatment may include: °· Acid blockers for  heartburn. °· Anti-inflammatory medicine. °· Pain medicine for inflammatory conditions. °· Antibiotics if an infection is present. °· You may be advised to change lifestyle habits. This includes stopping smoking and avoiding alcohol, caffeine, and chocolate. °· You may be advised to keep your head raised (elevated) when sleeping. This reduces the chance of acid going backward from your stomach into your esophagus. °Most of the time, nonspecific chest pain will improve within 2-3 days with rest and mild pain medicine.  °HOME CARE INSTRUCTIONS  °· If antibiotics were prescribed, take them as directed. Finish them even if you start to feel better. °· For the next few days, avoid physical activities that bring on chest pain. Continue physical activities as directed. °· Do not use any tobacco products, including cigarettes, chewing tobacco, or electronic cigarettes. °· Avoid drinking alcohol. °· Only take medicine as directed by your health care provider. °· Follow your health care provider's suggestions for further testing if your chest pain does not go away. °· Keep any follow-up appointments you made. If you do not go to an appointment, you could develop lasting (chronic) problems with pain. If there is any problem keeping an appointment, call to reschedule. °SEEK MEDICAL CARE IF:  °· Your chest pain does not go away, even after treatment. °· You have a rash with blisters on your chest. °· You have a fever. °SEEK IMMEDIATE MEDICAL CARE IF:  °· You have increased chest pain or pain that spreads to your arm, neck, jaw, back, or abdomen. °· You have shortness of breath. °· You have an increasing cough, or you cough   up blood.  You have severe back or abdominal pain.  You feel nauseous or vomit.  You have severe weakness.  You faint.  You have chills. This is an emergency. Do not wait to see if the pain will go away. Get medical help at once. Call your local emergency services (911 in [place] Do not drive  yourself to the hospital. MAKE SURE YOU:   Understand these instructions.  Will watch your condition.  Will get help right away if you are not doing well or get worse. Document Released: 06/21/2005 Document Revised: 09/16/2013 Document Reviewed: 04/16/2008 [facility] Patient Information 2015 [place], [place]. This information is not intended to replace advice given to you by your health care provider. Make sure you discuss any questions you have with your health care provider.  Sty A sty (hordeolum) is an infection of a gland in the eyelid located at the base of the eyelash. A sty may develop a white or yellow head of pus. It can be puffy (swollen). Usually, the sty will burst and pus will come out on its own. They do not leave lumps in the eyelid once they drain. A sty is often confused with another form of cyst of the eyelid called a chalazion. Chalazions occur within the eyelid and not on the edge where the bases of the eyelashes are. They often are red, sore and then form firm lumps in the eyelid. CAUSES   Germs (bacteria).  Lasting (chronic) eyelid inflammation. SYMPTOMS   Tenderness, redness and swelling along the edge of the eyelid at the base of the eyelashes.  Sometimes, there is a white or yellow head of pus. It may or may not drain. DIAGNOSIS  An ophthalmologist will be able to distinguish between a sty and a chalazion and treat the condition appropriately.  TREATMENT   Styes are typically treated with warm packs (compresses) until drainage occurs.  In rare cases, medicines that kill germs (antibiotics) may be prescribed. These antibiotics may be in the form of drops, cream or pills.  If a hard lump has formed, it is generally necessary to do a small incision and remove the hardened contents of the cyst in a minor surgical procedure done in the office.  In suspicious cases, your caregiver may send the contents of the cyst to the lab to be certain that it is not a rare, but  dangerous form of cancer of the glands of the eyelid. HOME CARE INSTRUCTIONS   Wash your hands often and dry them with a clean towel. Avoid touching your eyelid. This may spread the infection to other parts of the eye.  Apply heat to your eyelid for 10 to 20 minutes, several times a day, to ease pain and help to heal it faster.  Do not squeeze the sty. Allow it to drain on its own. Wash your eyelid carefully 3 to 4 times per day to remove any pus. SEEK IMMEDIATE MEDICAL CARE IF:   Your eye becomes painful or puffy (swollen).  Your vision changes.  Your sty does not drain by itself within 3 days.  Your sty comes back within a short period of time, even with treatment.  You have redness (inflammation) around the eye.  You have a fever. Document Released: 06/21/2005 Document Revised: 12/04/2011 Document Reviewed: 12/26/2013 [facility] Patient Information 2015 [place], [place]. This information is not intended to replace advice given to you by your health care provider. Make sure you discuss any questions you have with your health care provider.

## 2015-02-27 NOTE — ED Notes (Signed)
Patient reports had an episode of chest pain last night while at work. States she thinks it was related to stress. Patient reports she took two sublingual nitroglycerins that belonged to someone else and the pain went away after the second dose. Denies chest pain at this time. States hasn't had any chest pain today. States feels very fatigued. Reports has also had some facial pain from left eye to cheek. States did have some swelling to left side of face that has reduced with warm compress.

## 2015-06-26 ENCOUNTER — Emergency Department (HOSPITAL_COMMUNITY)
Admission: EM | Admit: 2015-06-26 | Discharge: 2015-06-26 | Disposition: A | Discharge status: Home / Self Care | Payer: Self-pay | Attending: Emergency Medicine | Admitting: Emergency Medicine

## 2015-06-26 ENCOUNTER — Encounter (HOSPITAL_COMMUNITY): Payer: Self-pay

## 2015-06-26 DIAGNOSIS — S39012A Strain of muscle, fascia and tendon of lower back, initial encounter: Secondary | ICD-10-CM | POA: Insufficient documentation

## 2015-06-26 DIAGNOSIS — X58XXXA Exposure to other specified factors, initial encounter: Secondary | ICD-10-CM | POA: Insufficient documentation

## 2015-06-26 DIAGNOSIS — Z9981 Dependence on supplemental oxygen: Secondary | ICD-10-CM | POA: Insufficient documentation

## 2015-06-26 DIAGNOSIS — Z79899 Other long term (current) drug therapy: Secondary | ICD-10-CM | POA: Insufficient documentation

## 2015-06-26 DIAGNOSIS — Y93F2 Activity, caregiving, lifting: Secondary | ICD-10-CM | POA: Insufficient documentation

## 2015-06-26 DIAGNOSIS — G4730 Sleep apnea, unspecified: Secondary | ICD-10-CM | POA: Insufficient documentation

## 2015-06-26 DIAGNOSIS — F419 Anxiety disorder, unspecified: Secondary | ICD-10-CM | POA: Insufficient documentation

## 2015-06-26 DIAGNOSIS — Y998 Other external cause status: Secondary | ICD-10-CM | POA: Insufficient documentation

## 2015-06-26 DIAGNOSIS — Y9289 Other specified places as the place of occurrence of the external cause: Secondary | ICD-10-CM | POA: Insufficient documentation

## 2015-06-26 MED ORDER — CYCLOBENZAPRINE HCL 10 MG PO TABS
5.0000 mg | ORAL_TABLET | Freq: Once | ORAL | Status: AC
  Administered 2015-06-26: 5 mg via ORAL
  Filled 2015-06-26: qty 1

## 2015-06-26 MED ORDER — CYCLOBENZAPRINE HCL 5 MG PO TABS: 5.0000 mg | ORAL_TABLET | Freq: Three times a day (TID) | ORAL | Status: DC | PRN

## 2015-06-26 MED ORDER — KETOROLAC TROMETHAMINE 60 MG/2ML IM SOLN
60.0000 mg | Freq: Once | INTRAMUSCULAR | Status: AC
  Administered 2015-06-26: 60 mg via INTRAMUSCULAR
  Filled 2015-06-26: qty 2

## 2015-06-26 MED ORDER — NAPROXEN 500 MG PO TABS: 500.0000 mg | ORAL_TABLET | Freq: Two times a day (BID) | ORAL | Status: DC

## 2015-06-26 NOTE — ED Provider Notes (Signed)
CSN: [id number]     Arrival date & time 06/26/15  0048 History   First MD Initiated Contact with Patient 06/26/15 0059     Chief Complaint  Patient presents with  . Back Pain     (Consider location/radiation/quality/duration/timing/severity/associated sxs/prior Treatment) HPI  This a 39 year old female who presents with back pain. Patient reports that she started a new job proximally 6 weeks ago. She is a Chief Strategy Officer at [employer] and has had worsening back pain since starting her job. 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 Currently her pain as 9 out of 10. States the pain is achy and nonradiating. Denies any bowel or bladder difficulty. Denies any weakness, numbness, or tingling of the lower extremities. She has taken ibuprofen at home with minimal relief.  Past Medical History  Diagnosis Date  . Sleep apnea   . Sleep apnea     uses cpap  . Anxiety     on pristiq  daily  . Morbid obesity with BMI of 50.0-59.9, adult ([facility]) 10/22/2013  . Anxiety    Past Surgical History  Procedure Laterality Date  . Tubal ligation     No family history on file. Social History  Substance Use Topics  . Smoking status: Never Smoker   . Smokeless tobacco: None  . Alcohol Use: No   OB History    No data available     Review of Systems  Genitourinary: Negative for difficulty urinating.  Musculoskeletal: Positive for back pain.  Neurological: Negative for weakness and numbness.  All other systems reviewed and are negative.     Allergies  Review of patient's allergies indicates no known allergies.  Home Medications   Prior to Admission medications   Medication Sig Start Date End Date Taking? Authorizing Provider  ALPRAZolam Prudy Feeler) 0.5 MG tablet Take 0.5 mg by mouth daily as needed for anxiety.   Yes Historical Provider, MD  ferrous sulfate 325 (65 FE) MG tablet Take 325 mg by mouth daily  with breakfast.   Yes Historical Provider, MD  FLUoxetine (PROZAC) 40 MG capsule Take 40 mg by mouth daily.   Yes Historical Provider, MD  montelukast (SINGULAIR) 10 MG tablet Take 10 mg by mouth daily.   Yes Historical Provider, MD  cyclobenzaprine (FLEXERIL) 5 MG tablet Take 1 tablet (5 mg total) by mouth 3 (three) times daily as needed for muscle spasms. 06/26/15   Shon Baton, MD  naproxen (NAPROSYN) 500 MG tablet Take 1 tablet (500 mg total) by mouth 2 (two) times daily. 06/26/15   Shon Baton, MD  traMADol (ULTRAM) 50 MG tablet Take 1 tablet (50 mg total) by mouth every 6 (six) hours as needed. 12/15/14   Tammy Triplett, PA-C   BP 124/75 mmHg  Pulse 60  Temp(Src) 97 F (36.1 C) (Oral)  Resp 18  Ht  (1.575 m)  Wt 313 lb (141.976 kg)  BMI 57.23 kg/m2  SpO2 96%  LMP 06/12/2015 Physical Exam  Constitutional: She is oriented to person, place, and time. She appears well-developed and well-nourished.  Obese  HENT:  Head: Normocephalic and atraumatic.  Cardiovascular: Normal rate and regular rhythm.   Pulmonary/Chest: Effort normal. No respiratory distress.  Musculoskeletal:  Tenderness to palpation over the left paraspinous muscle region of the lower lumbar spine, no midline step-off, tenderness, or deformity, negative straight leg raise  Neurological: She is alert and oriented to person,  place, and time.  5 out of 5 strength bilateral lower extremities, no clonus noted  Skin: Skin is warm and dry.  Psychiatric: She has a normal mood and affect.  Nursing note and vitals reviewed.   ED Course  Procedures (including critical care time) Labs Review Labs Reviewed - No data to display  Imaging Review No results found. I have personally reviewed and evaluated these images and lab results as part of my medical decision-making.   EKG Interpretation None      MDM   Final diagnoses:  Lumbosacral strain, initial encounter    Patient presents with worsening back  pain. No signs or symptoms of cauda equina. Suspect musculoskeletal strain. No indication at this time for imaging. No midline tenderness. Patient was given Toradol and Flexeril. She was able to ambulate in the hall with minimal difficulty. She reports some improvement of pain with medications. Discussed with patient anti-inflammatory medications and muscle relaxants at home for comfort. She may also benefit from physical therapy and weight loss. Patient stated understanding and will follow-up with her primary physician.  After history, exam, and medical workup I feel the patient has been appropriately medically screened and is safe for discharge home. Pertinent diagnoses were discussed with the patient. Patient was given return precautions.     Shon Baton, MD 06/26/15 [phone]

## 2015-06-26 NOTE — ED Notes (Signed)
Discharge instructions given, pt demonstrated teach back and verbal understanding. No concerns voiced.  

## 2015-06-26 NOTE — ED Notes (Signed)
Ambulated pt in hall with nurse tech and pt daughter following. Pt stated she felt a  Little "tight at firtst" but as she walked, she felt more "comfortable". Tolerated ambulation well.

## 2015-06-26 NOTE — Discharge Instructions (Signed)

## 2015-06-26 NOTE — ED Notes (Signed)
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

## 2015-08-03 ENCOUNTER — Encounter (HOSPITAL_COMMUNITY): Payer: Self-pay | Admitting: Emergency Medicine

## 2015-08-03 ENCOUNTER — Emergency Department (HOSPITAL_COMMUNITY)
Admission: EM | Admit: 2015-08-03 | Discharge: 2015-08-03 | Disposition: A | Discharge status: Home / Self Care | Payer: Self-pay | Attending: Emergency Medicine | Admitting: Emergency Medicine

## 2015-08-03 DIAGNOSIS — G4730 Sleep apnea, unspecified: Secondary | ICD-10-CM | POA: Insufficient documentation

## 2015-08-03 DIAGNOSIS — F419 Anxiety disorder, unspecified: Secondary | ICD-10-CM | POA: Insufficient documentation

## 2015-08-03 DIAGNOSIS — Y990 Civilian activity done for income or pay: Secondary | ICD-10-CM | POA: Insufficient documentation

## 2015-08-03 DIAGNOSIS — Z79899 Other long term (current) drug therapy: Secondary | ICD-10-CM | POA: Insufficient documentation

## 2015-08-03 DIAGNOSIS — S39012A Strain of muscle, fascia and tendon of lower back, initial encounter: Secondary | ICD-10-CM | POA: Insufficient documentation

## 2015-08-03 DIAGNOSIS — W01198A Fall on same level from slipping, tripping and stumbling with subsequent striking against other object, initial encounter: Secondary | ICD-10-CM | POA: Insufficient documentation

## 2015-08-03 DIAGNOSIS — S40812A Abrasion of left upper arm, initial encounter: Secondary | ICD-10-CM | POA: Insufficient documentation

## 2015-08-03 DIAGNOSIS — Z791 Long term (current) use of non-steroidal anti-inflammatories (NSAID): Secondary | ICD-10-CM | POA: Insufficient documentation

## 2015-08-03 DIAGNOSIS — Y9389 Activity, other specified: Secondary | ICD-10-CM | POA: Insufficient documentation

## 2015-08-03 DIAGNOSIS — S80211A Abrasion, right knee, initial encounter: Secondary | ICD-10-CM | POA: Insufficient documentation

## 2015-08-03 DIAGNOSIS — S40811A Abrasion of right upper arm, initial encounter: Secondary | ICD-10-CM | POA: Insufficient documentation

## 2015-08-03 DIAGNOSIS — W19XXXA Unspecified fall, initial encounter: Secondary | ICD-10-CM

## 2015-08-03 DIAGNOSIS — T07XXXA Unspecified multiple injuries, initial encounter: Secondary | ICD-10-CM

## 2015-08-03 DIAGNOSIS — Y92512 Supermarket, store or market as the place of occurrence of the external cause: Secondary | ICD-10-CM | POA: Insufficient documentation

## 2015-08-03 DIAGNOSIS — Z9981 Dependence on supplemental oxygen: Secondary | ICD-10-CM | POA: Insufficient documentation

## 2015-08-03 MED ORDER — HYDROCODONE-ACETAMINOPHEN 5-325 MG PO TABS: 1.0000 | ORAL_TABLET | Freq: Four times a day (QID) | ORAL | Status: DC | PRN

## 2015-08-03 MED ORDER — IBUPROFEN 800 MG PO TABS: 800.0000 mg | ORAL_TABLET | Freq: Three times a day (TID) | ORAL | Status: DC | PRN

## 2015-08-03 MED ORDER — TRAMADOL HCL 50 MG PO TABS: 50.0000 mg | ORAL_TABLET | Freq: Four times a day (QID) | ORAL | Status: DC | PRN

## 2015-08-03 MED ORDER — IBUPROFEN 800 MG PO TABS
800.0000 mg | ORAL_TABLET | Freq: Once | ORAL | Status: AC
  Administered 2015-08-03: 800 mg via ORAL
  Filled 2015-08-03: qty 1

## 2015-08-03 NOTE — Discharge Instructions (Signed)
Lumbosacral Strain Lumbosacral strain is a strain of any of the parts that make up your lumbosacral vertebrae. Your lumbosacral vertebrae are the bones that make up the lower third of your backbone. Your lumbosacral vertebrae are held together by muscles and tough, fibrous tissue (ligaments).  CAUSES  A sudden blow to your back can cause lumbosacral strain. Also, anything that causes an excessive stretch of the muscles in the low back can cause this strain. This is typically seen when people exert themselves strenuously, fall, lift heavy objects, bend, or crouch repeatedly. RISK FACTORS  Physically demanding work.  Participation in pushing or pulling sports or sports that require a sudden twist of the back (tennis, golf, baseball).  Weight lifting.  Excessive lower back curvature.  Forward-tilted pelvis.  Weak back or abdominal muscles or both.  Tight hamstrings. SIGNS AND SYMPTOMS  Lumbosacral strain may cause pain in the area of your injury or pain that moves (radiates) down your leg.  DIAGNOSIS Your health care provider can often diagnose lumbosacral strain through a physical exam. In some cases, you may need tests such as X-ray exams.  TREATMENT  Treatment for your lower back injury depends on many factors that your clinician will have to evaluate. However, most treatment will include the use of anti-inflammatory medicines. HOME CARE INSTRUCTIONS   Avoid hard physical activities (tennis, racquetball, waterskiing) if you are not in proper physical condition for it. This may aggravate or create problems.  If you have a back problem, avoid sports requiring sudden body movements. Swimming and walking are generally safer activities.  Maintain good posture.  Maintain a healthy weight.  For acute conditions, you may put ice on the injured area.  Put ice in a plastic bag.  Place a towel between your skin and the bag.  Leave the ice on for 20 minutes, 2-3 times a day.  When the  low back starts healing, stretching and strengthening exercises may be recommended. SEEK MEDICAL CARE IF:  Your back pain is getting worse.  You experience severe back pain not relieved with medicines. SEEK IMMEDIATE MEDICAL CARE IF:   You have numbness, tingling, weakness, or problems with the use of your arms or legs.  There is a change in bowel or bladder control.  You have increasing pain in any area of the body, including your belly (abdomen).  You notice shortness of breath, dizziness, or feel faint.  You feel sick to your stomach (nauseous), are throwing up (vomiting), or become sweaty.  You notice discoloration of your toes or legs, or your feet get very cold. MAKE SURE YOU:   Understand these instructions.  Will watch your condition.  Will get help right away if you are not doing well or get worse.   This information is not intended to replace advice given to you by your health care provider. Make sure you discuss any questions you have with your health care provider.   Document Released: 06/21/2005 Document Revised: 10/02/2014 Document Reviewed: 04/30/2013 Elsevier Interactive Patient Education 2016 [employer] Inc[employer]   RICE for Routine Care of Injuries Theroutine careofmanyinjuriesincludes rest, ice, compression, and elevation (RICE therapy). RICE therapy is often recommended for injuries to soft tissues, such as a muscle strain, ligament injuries, bruises, and overuse injuries. It can also be used for some bony injuries. Using RICE therapy can help to relieve pain, lessen swelling, and enable your body to heal. Rest Rest is required to allow your body to heal. This usually involves reducing your normal activities and  avoiding use of the injured part of your body. Generally, you can return to your normal activities when you are comfortable and have been given permission by your health care provider. Ice Icing your injury helps to keep the swelling down, and it  lessens pain. Do not apply ice directly to your skin.  Put ice in a plastic bag.  Place a towel between your skin and the bag.  Leave the ice on for 20 minutes, 2-3 times a day. Do this for as long as you are directed by your health care provider. Compression Compression means putting pressure on the injured area. Compression helps to keep swelling down, gives support, and helps with discomfort. Compression may be done with an elastic bandage. If an elastic bandage has been applied, follow these general tips:  Remove and reapply the bandage every 3-4 hours or as directed by your health care provider.  Make sure the bandage is not wrapped too tightly, because this can cut off circulation. If part of your body beyond the bandage becomes blue, numb, cold, swollen, or more painful, your bandage is most likely too tight. If this occurs, remove your bandage and reapply it more loosely.  See your health care provider if the bandage seems to be making your problems worse rather than better. Elevation Elevation means keeping the injured area raised. This helps to lessen swelling and decrease pain. If possible, your injured area should be elevated at or above the level of your heart or the center of your chest. WHEN SHOULD I SEEK MEDICAL CARE? You should seek medical care if:  Your pain and swelling continue.  Your symptoms are getting worse rather than improving. These symptoms may indicate that further evaluation or further X-rays are needed. Sometimes, X-rays may not show a small broken bone (fracture) until a number of days later. Make a follow-up appointment with your health care provider. WHEN SHOULD I SEEK IMMEDIATE MEDICAL CARE? You should seek immediate medical care if:  You have sudden severe pain at or below the area of your injury.  You have redness or increased swelling around your injury.  You have tingling or numbness at or below the area of your injury that does not improve  after you remove the elastic bandage.   This information is not intended to replace advice given to you by your health care provider. Make sure you discuss any questions you have with your health care provider.   Document Released: 12/24/2000 Document Revised: 06/02/2015 Document Reviewed: 08/19/2014   Abrasion An abrasion is a cut or scrape on the outer surface of your skin. An abrasion does not extend through all of the layers of your skin. It is important to care for your abrasion properly to prevent infection. CAUSES Most abrasions are caused by falling on or gliding across the ground or another surface. When your skin rubs on something, the outer and inner layer of skin rubs off.  SYMPTOMS A cut or scrape is the main symptom of this condition. The scrape may be bleeding, or it may appear red or pink. If there was an associated fall, there may be an underlying bruise. DIAGNOSIS An abrasion is diagnosed with a physical exam. TREATMENT Treatment for this condition depends on how large and deep the abrasion is. Usually, your abrasion will be cleaned with water and mild soap. This removes any dirt or debris that may be stuck. An antibiotic ointment may be applied to the abrasion to help prevent infection. A bandage (dressing)  may be placed on the abrasion to keep it clean. You may also need a tetanus shot. HOME CARE INSTRUCTIONS Medicines  Take or apply medicines only as directed by your health care provider.  If you were prescribed an antibiotic ointment, finish all of it even if you start to feel better. Wound Care  Clean the wound with mild soap and water 2-3 times per day or as directed by your health care provider. Pat your wound dry with a clean towel. Do not rub it.  There are many different ways to close and cover a wound. Follow instructions from your health care provider about:  Wound care.  Dressing changes and removal.  Check your wound every day for signs of infection.  Watch for:  Redness, swelling, or pain.  Fluid, blood, or pus. General Instructions  Keep the dressing dry as directed by your health care provider. Do not take baths, swim, use a hot tub, or do anything that would put your wound underwater until your health care provider approves.  If there is swelling, raise (elevate) the injured area above the level of your heart while you are sitting or lying down.  Keep all follow-up visits as directed by your health care provider. This is important. SEEK MEDICAL CARE IF:  You received a tetanus shot and you have swelling, severe pain, redness, or bleeding at the injection site.  Your pain is not controlled with medicine.  You have increased redness, swelling, or pain at the site of your wound. SEEK IMMEDIATE MEDICAL CARE IF:  You have a red streak going away from your wound.  You have a fever.  You have fluid, blood, or pus coming from your wound.  You notice a bad smell coming from your wound or your dressing.   This information is not intended to replace advice given to you by your health care provider. Make sure you discuss any questions you have with your health care provider.   Document Released: 06/21/2005 Document Revised: 06/02/2015 Document Reviewed: 09/09/2014 Elsevier Interactive Patient Education 2016 [employer]  [employer]vier Interactive Patient Education [employer] Elsevier Inc.

## 2015-08-03 NOTE — ED Notes (Signed)
   08/03/15 0132  Musculoskeletal  Musculoskeletal (WDL) X  Back Brace Lumbar (pain)  Range of Motion Active;All extremities  Posterior/Spine  Posterior Spine Trauma Present? No  Patient Logrolled No  Cervical WDL  Thoracic WDL  Lumbar Tenderness  Sacral Tenderness  Pt c/o pain to bilateral arms and knees from abrasions. Pt c/o lower back pain.

## 2015-08-03 NOTE — ED Notes (Signed)
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

## 2015-08-03 NOTE — ED Provider Notes (Signed)
TIME SEEN: 2:30 AM  CHIEF COMPLAINT: Fall, abrasions, back pain  HPI: Pt is a 39 y.o. with history of obesity, anxiety, sleep apnea who presents emergency department after she tripped and fell at work today. States she landed on both of her forearms and her right knee. States because of this she history in her lower back. Denies numbness, tingling or focal weakness. No bowel or bladder incontinence. No fever. No head injury or loss of consciousness. Not on anticoagulation. States her last tetanus vaccination was within the last 5 years.  ROS: See HPI Constitutional: no fever  Eyes: no drainage  ENT: no runny nose   Cardiovascular:  no chest pain  Resp: no SOB  GI: no vomiting GU: no dysuria Integumentary: no rash  Allergy: no hives  Musculoskeletal: no leg swelling  Neurological: no slurred speech ROS otherwise negative  PAST MEDICAL HISTORY/PAST SURGICAL HISTORY:  Past Medical History  Diagnosis Date  . Sleep apnea   . Sleep apnea     uses cpap  . Anxiety     on pristiq  daily  . Morbid obesity with BMI of 50.0-59.9, adult (HCC) 10/22/2013  . Anxiety     MEDICATIONS:  Prior to Admission medications   Medication Sig Start Date End Date Taking? Authorizing Provider  ALPRAZolam Prudy Feeler) 0.5 MG tablet Take 0.5 mg by mouth daily as needed for anxiety.    Historical Provider, MD  cyclobenzaprine (FLEXERIL) 5 MG tablet Take 1 tablet (5 mg total) by mouth 3 (three) times daily as needed for muscle spasms. 06/26/15   Shon Baton, MD  ferrous sulfate 325 (65 FE) MG tablet Take 325 mg by mouth daily with breakfast.    Historical Provider, MD  FLUoxetine (PROZAC) 40 MG capsule Take 40 mg by mouth daily.    Historical Provider, MD  montelukast (SINGULAIR) 10 MG tablet Take 10 mg by mouth daily.    Historical Provider, MD  naproxen (NAPROSYN) 500 MG tablet Take 1 tablet (500 mg total) by mouth 2 (two) times daily. 06/26/15   Shon Baton, MD  traMADol (ULTRAM) 50 MG tablet Take  1 tablet (50 mg total) by mouth every 6 (six) hours as needed. 12/15/14   Tammy Triplett, PA-C    ALLERGIES:  No Known Allergies  SOCIAL HISTORY:  Social History  Substance Use Topics  . Smoking status: Never Smoker   . Smokeless tobacco: Not on file  . Alcohol Use: No    FAMILY HISTORY: History reviewed. No pertinent family history.  EXAM: BP 125/70 mmHg  Pulse 68  Temp(Src) 98.1 F (36.7 C) (Oral)  Resp 20  Ht  (1.575 m)  Wt 306 lb (138.801 kg)  BMI 55.95 kg/m2  SpO2 99%  LMP 07/03/2015 CONSTITUTIONAL: Alert and oriented and responds appropriately to questions. Well-appearing; well-nourished; GCS 15, obese HEAD: Normocephalic; atraumatic EYES: Conjunctivae clear, PERRL, EOMI ENT: normal nose; no rhinorrhea; moist mucous membranes; pharynx without lesions noted; no dental injury; no septal hematoma NECK: Supple, no meningismus, no LAD; no midline spinal tenderness, step-off or deformity CARD: RRR; S1 and S2 appreciated; no murmurs, no clicks, no rubs, no gallops RESP: Normal chest excursion without splinting or tachypnea; breath sounds clear and equal bilaterally; no wheezes, no rhonchi, no rales; no hypoxia or respiratory distress CHEST:  chest wall stable, no crepitus or ecchymosis or deformity, nontender to palpation ABD/GI: Normal bowel sounds; non-distended; soft, non-tender, no rebound, no guarding PELVIS:  stable, nontender to palpation BACK:  The back appears normal  and is tender throughout the lumbar paraspinal musculature, there is no CVA tenderness; no midline spinal tenderness, step-off or deformity EXT: Normal ROM in all joints; non-tender to palpation; no edema; normal capillary refill; no cyanosis, no bony tenderness or bony deformity of patient's extremities, no joint effusion, no ecchymosis or lacerations    SKIN: Normal color for age and race; abrasions to bilateral forearms and right knee without bony tenderness or deformity NEURO: Moves all  extremities equally, sensation to light touch intact diffusely, cranial nerves II through XII intact, normal gait, no saddle anesthesia PSYCH: The patient's mood and manner are appropriate. Grooming and personal hygiene are appropriate.  MEDICAL DECISION MAKING: Patient here with mechanical fall with abrasions to her bilateral arms and right knee. She states she does not feel like anything in her arms or legs are broken and does not want x-rays. She does have diffuse tenderness throughout the paraspinal musculature of the lumbar spine without midline spinal tenderness. She is neurologically intact. She states tramadol has helped her pain in the past. We'll discharge with prescription for ibuprofen, tramadol for musculoskeletal strain. I recommend she wash her wounds with warm soap and water and Neosporin over-the-counter as needed. She is up-to-date on tetanus vaccination. No head injury. I do not feel at this time she needs emergent imaging.   I do not feel there is any life-threatening condition present. Discussed all results, exam findings with patient. I feel the patient is safe to be discharged home without further emergent workup. Discussed usual and customary return precautions. Patient and family (if present) verbalize understanding and are comfortable with this plan.  Patient will follow-up with their primary care provider. If they do not have a primary care provider, information for follow-up has been provided to them. All questions have been answered.          Ana MawKristen N Terrance Lanahan, DO 08/03/15 [phone]

## 2015-08-03 NOTE — ED Notes (Signed)
Patient verbalizes understanding of discharge instructions, prescription medications, home care and follow up care. Patient ambulatory out of department at this time. 

## 2015-08-09 MED FILL — Hydrocodone-Acetaminophen Tab 5-325 MG: ORAL | Qty: 6 | Status: AC

## 2015-09-09 ENCOUNTER — Emergency Department (HOSPITAL_COMMUNITY): Payer: Self-pay

## 2015-09-09 ENCOUNTER — Encounter (HOSPITAL_COMMUNITY): Payer: Self-pay

## 2015-09-09 ENCOUNTER — Emergency Department (HOSPITAL_COMMUNITY)
Admission: EM | Admit: 2015-09-09 | Discharge: 2015-09-09 | Disposition: A | Discharge status: Home / Self Care | Payer: Self-pay | Attending: Emergency Medicine | Admitting: Emergency Medicine

## 2015-09-09 DIAGNOSIS — J209 Acute bronchitis, unspecified: Secondary | ICD-10-CM | POA: Insufficient documentation

## 2015-09-09 DIAGNOSIS — Z79899 Other long term (current) drug therapy: Secondary | ICD-10-CM | POA: Insufficient documentation

## 2015-09-09 DIAGNOSIS — G4730 Sleep apnea, unspecified: Secondary | ICD-10-CM | POA: Insufficient documentation

## 2015-09-09 DIAGNOSIS — Z791 Long term (current) use of non-steroidal anti-inflammatories (NSAID): Secondary | ICD-10-CM | POA: Insufficient documentation

## 2015-09-09 DIAGNOSIS — Z9981 Dependence on supplemental oxygen: Secondary | ICD-10-CM | POA: Insufficient documentation

## 2015-09-09 DIAGNOSIS — F419 Anxiety disorder, unspecified: Secondary | ICD-10-CM | POA: Insufficient documentation

## 2015-09-09 MED ORDER — PREDNISONE 50 MG PO TABS
60.0000 mg | ORAL_TABLET | Freq: Once | ORAL | Status: AC
  Administered 2015-09-09: 60 mg via ORAL
  Filled 2015-09-09: qty 1

## 2015-09-09 MED ORDER — IPRATROPIUM-ALBUTEROL 0.5-2.5 (3) MG/3ML IN SOLN
3.0000 mL | Freq: Once | RESPIRATORY_TRACT | Status: AC
Start: 2015-09-09 — End: 2015-09-09
  Administered 2015-09-09: 3 mL via RESPIRATORY_TRACT
  Filled 2015-09-09: qty 3

## 2015-09-09 MED ORDER — ALBUTEROL SULFATE HFA 108 (90 BASE) MCG/ACT IN AERS
2.0000 | INHALATION_SPRAY | RESPIRATORY_TRACT | Status: DC | PRN
  Administered 2015-09-09: 2 via RESPIRATORY_TRACT
  Filled 2015-09-09: qty 6.7

## 2015-09-09 MED ORDER — PREDNISONE 20 MG PO TABS: 40.0000 mg | ORAL_TABLET | Freq: Every day | ORAL | Status: DC

## 2015-09-09 NOTE — ED Notes (Signed)
Pt reports persistent cough for almost a week, states she is also having pain in her back with breathing.

## 2015-09-09 NOTE — Discharge Instructions (Signed)
Get your flu shot!   Acute Bronchitis Bronchitis is inflammation of the airways that extend from the windpipe into the lungs (bronchi). The inflammation often causes mucus to develop. This leads to a cough, which is the most common symptom of bronchitis.  In acute bronchitis, the condition usually develops suddenly and goes away over time, usually in a couple weeks. Smoking, allergies, and asthma can make bronchitis worse. Repeated episodes of bronchitis may cause further lung problems.  CAUSES Acute bronchitis is most often caused by the same virus that causes a cold. The virus can spread from person to person (contagious) through coughing, sneezing, and touching contaminated objects. SIGNS AND SYMPTOMS   Cough.   Fever.   Coughing up mucus.   Body aches.   Chest congestion.   Chills.   Shortness of breath.   Sore throat.  DIAGNOSIS  Acute bronchitis is usually diagnosed through a physical exam. Your health care provider will also ask you questions about your medical history. Tests, such as chest X-rays, are sometimes done to rule out other conditions.  TREATMENT  Acute bronchitis usually goes away in a couple weeks. Oftentimes, no medical treatment is necessary. Medicines are sometimes given for relief of fever or cough. Antibiotic medicines are usually not needed but may be prescribed in certain situations. In some cases, an inhaler may be recommended to help reduce shortness of breath and control the cough. A cool mist vaporizer may also be used to help thin bronchial secretions and make it easier to clear the chest.  HOME CARE INSTRUCTIONS  Get plenty of rest.   Drink enough fluids to keep your urine clear or pale yellow (unless you have a medical condition that requires fluid restriction). Increasing fluids may help thin your respiratory secretions (sputum) and reduce chest congestion, and it will prevent dehydration.   Take medicines only as directed by your health  care provider.  If you were prescribed an antibiotic medicine, finish it all even if you start to feel better.  Avoid smoking and secondhand smoke. Exposure to cigarette smoke or irritating chemicals will make bronchitis worse. If you are a smoker, consider using nicotine gum or skin patches to help control withdrawal symptoms. Quitting smoking will help your lungs heal faster.   Reduce the chances of another bout of acute bronchitis by washing your hands frequently, avoiding people with cold symptoms, and trying not to touch your hands to your mouth, nose, or eyes.   Keep all follow-up visits as directed by your health care provider.  SEEK MEDICAL CARE IF: Your symptoms do not improve after 1 week of treatment.  SEEK IMMEDIATE MEDICAL CARE IF:  You develop an increased fever or chills.   You have chest pain.   You have severe shortness of breath.  You have bloody sputum.   You develop dehydration.  You faint or repeatedly feel like you are going to pass out.  You develop repeated vomiting.  You develop a severe headache. MAKE SURE YOU:   Understand these instructions.  Will watch your condition.  Will get help right away if you are not doing well or get worse.   This information is not intended to replace advice given to you by your health care provider. Make sure you discuss any questions you have with your health care provider.   Document Released: 10/19/2004 Document Revised: 10/02/2014 Document Reviewed: 03/04/2013 Elsevier Interactive Patient Education 2016 [employer]  Albuterol inhalation aerosol What is this medicine? ALBUTEROL (al Gaspar Bidding)  is a bronchodilator. It helps open up the airways in your lungs to make it easier to breathe. This medicine is used to treat and to prevent bronchospasm. This medicine may be used for other purposes; ask your health care provider or pharmacist if you have questions. What should I tell my health care provider  before I take this medicine? They need to know if you have any of the following conditions: -diabetes -heart disease or irregular heartbeat -high blood pressure -pheochromocytoma -seizures -thyroid disease -an unusual or allergic reaction to albuterol, levalbuterol, sulfites, other medicines, foods, dyes, or preservatives -pregnant or trying to get pregnant -breast-feeding How should I use this medicine? This medicine is for inhalation through the mouth. Follow the directions on your prescription label. Take your medicine at regular intervals. Do not use more often than directed. Make sure that you are using your inhaler correctly. Ask you doctor or health care provider if you have any questions. Talk to your pediatrician regarding the use of this medicine in children. Special care may be needed. Overdosage: If you think you have taken too much of this medicine contact a poison control center or emergency room at once. NOTE: This medicine is only for you. Do not share this medicine with others. What if I miss a dose? If you miss a dose, use it as soon as you can. If it is almost time for your next dose, use only that dose. Do not use double or extra doses. What may interact with this medicine? -anti-infectives like chloroquine and pentamidine -caffeine -cisapride -diuretics -medicines for colds -medicines for depression or for emotional or psychotic conditions -medicines for weight loss including some herbal products -methadone -some antibiotics like clarithromycin, erythromycin, levofloxacin, and linezolid -some heart medicines -steroid hormones like dexamethasone, cortisone, hydrocortisone -theophylline -thyroid hormones This list may not describe all possible interactions. Give your health care provider a list of all the medicines, herbs, non-prescription drugs, or dietary supplements you use. Also tell them if you smoke, drink alcohol, or use illegal drugs. Some items may interact  with your medicine. What should I watch for while using this medicine? Tell your doctor or health care professional if your symptoms do not improve. Do not use extra albuterol. If your asthma or bronchitis gets worse while you are using this medicine, call your doctor right away. If your mouth gets dry try chewing sugarless gum or sucking hard candy. Drink water as directed. What side effects may I notice from receiving this medicine? Side effects that you should report to your doctor or health care professional as soon as possible: -allergic reactions like skin rash, itching or hives, swelling of the face, lips, or tongue -breathing problems -chest pain -feeling faint or lightheaded, falls -high blood pressure -irregular heartbeat -fever -muscle cramps or weakness -pain, tingling, numbness in the hands or feet -vomiting Side effects that usually do not require medical attention (report to your doctor or health care professional if they continue or are bothersome): -cough -difficulty sleeping -headache -nervousness or trembling -stomach upset -stuffy or runny nose -throat irritation -unusual taste This list may not describe all possible side effects. Call your doctor for medical advice about side effects. You may report side effects to FDA at 1-800-FDA-1088. Where should I keep my medicine? Keep out of the reach of children. Store at room temperature between 15 and 30 degrees C (59 and 86 degrees F). The contents are under pressure and may burst when exposed to heat or flame. Do not freeze.  This medicine does not work as well if it is too cold. Throw away any unused medicine after the expiration date. Inhalers need to be thrown away after the labeled number of puffs have been used or by the expiration date; whichever comes first. Ventolin HFA should be thrown away 12 months after removing from foil pouch. Check the instructions that come with your medicine. NOTE: This sheet is a summary.  It may not cover all possible information. If you have questions about this medicine, talk to your doctor, pharmacist, or health care provider.    2016, Elsevier/Gold Standard. (2013-02-27 10:57:17)  Prednisone tablets What is this medicine? PREDNISONE (PRED ni sone) is a corticosteroid. It is commonly used to treat inflammation of the skin, joints, lungs, and other organs. Common conditions treated include asthma, allergies, and arthritis. It is also used for other conditions, such as blood disorders and diseases of the adrenal glands. This medicine may be used for other purposes; ask your health care provider or pharmacist if you have questions. What should I tell my health care provider before I take this medicine? They need to know if you have any of these conditions: -Cushing's syndrome -diabetes -glaucoma -heart disease -high blood pressure -infection (especially a virus infection such as chickenpox, cold sores, or herpes) -kidney disease -liver disease -mental illness -myasthenia gravis -osteoporosis -seizures -stomach or intestine problems -thyroid disease -an unusual or allergic reaction to lactose, prednisone, other medicines, foods, dyes, or preservatives -pregnant or trying to get pregnant -breast-feeding How should I use this medicine? Take this medicine by mouth with a glass of water. Follow the directions on the prescription label. Take this medicine with food. If you are taking this medicine once a day, take it in the morning. Do not take more medicine than you are told to take. Do not suddenly stop taking your medicine because you may develop a severe reaction. Your doctor will tell you how much medicine to take. If your doctor wants you to stop the medicine, the dose may be slowly lowered over time to avoid any side effects. Talk to your pediatrician regarding the use of this medicine in children. Special care may be needed. Overdosage: If you think you have taken too  much of this medicine contact a poison control center or emergency room at once. NOTE: This medicine is only for you. Do not share this medicine with others. What if I miss a dose? If you miss a dose, take it as soon as you can. If it is almost time for your next dose, talk to your doctor or health care professional. You may need to miss a dose or take an extra dose. Do not take double or extra doses without advice. What may interact with this medicine? Do not take this medicine with any of the following medications: -metyrapone -mifepristone This medicine may also interact with the following medications: -aminoglutethimide -amphotericin B -aspirin and aspirin-like medicines -barbiturates -certain medicines for diabetes, like glipizide or glyburide -cholestyramine -cholinesterase inhibitors -cyclosporine -digoxin -diuretics -ephedrine -female hormones, like estrogens and birth control pills -isoniazid -ketoconazole -NSAIDS, medicines for pain and inflammation, like ibuprofen or naproxen -phenytoin -rifampin -toxoids -vaccines -warfarin This list may not describe all possible interactions. Give your health care provider a list of all the medicines, herbs, non-prescription drugs, or dietary supplements you use. Also tell them if you smoke, drink alcohol, or use illegal drugs. Some items may interact with your medicine. What should I watch for while using this medicine? Visit your  doctor or health care professional for regular checks on your progress. If you are taking this medicine over a prolonged period, carry an identification card with your name and address, the type and dose of your medicine, and your doctor's name and address. This medicine may increase your risk of getting an infection. Tell your doctor or health care professional if you are around anyone with measles or chickenpox, or if you develop sores or blisters that do not heal properly. If you are going to have surgery,  tell your doctor or health care professional that you have taken this medicine within the last twelve months. Ask your doctor or health care professional about your diet. You may need to lower the amount of salt you eat. This medicine may affect blood sugar levels. If you have diabetes, check with your doctor or health care professional before you change your diet or the dose of your diabetic medicine. What side effects may I notice from receiving this medicine? Side effects that you should report to your doctor or health care professional as soon as possible: -allergic reactions like skin rash, itching or hives, swelling of the face, lips, or tongue -changes in emotions or moods -changes in vision -depressed mood -eye pain -fever or chills, cough, sore throat, pain or difficulty passing urine -increased thirst -swelling of ankles, feet Side effects that usually do not require medical attention (report to your doctor or health care professional if they continue or are bothersome): -confusion, excitement, restlessness -headache -nausea, vomiting -skin problems, acne, thin and shiny skin -trouble sleeping -weight gain This list may not describe all possible side effects. Call your doctor for medical advice about side effects. You may report side effects to FDA at 1-800-FDA-1088. Where should I keep my medicine? Keep out of the reach of children. Store at room temperature between 15 and 30 degrees C (59 and 86 degrees F). Protect from light. Keep container tightly closed. Throw away any unused medicine after the expiration date. NOTE: This sheet is a summary. It may not cover all possible information. If you have questions about this medicine, talk to your doctor, pharmacist, or health care provider.    2016, Elsevier/Gold Standard. (2011-04-27 10:57:14)

## 2015-09-09 NOTE — ED Provider Notes (Signed)
CSN: [id number]     Arrival date & time 09/09/15  0217 History   First MD Initiated Contact with Patient 09/09/15 0224     Chief Complaint  Patient presents with  . Cough     (Consider location/radiation/quality/duration/timing/severity/associated sxs/prior Treatment) Patient is a 39 y.o. female presenting with cough. The history is provided by the patient.  Cough She has been sick for about 5 days with a nonproductive cough. She has had subjective fever and chills without sweats. There's been some nasal congestion but no rhinorrhea. She denies sore throat. She denies vomiting or diarrhea. She denies arthralgias or myalgias. There has been some mild to moderate dyspnea. She has treated herself with cough drops and NyQuil without relief. Also, she has not had her flu shot this year. She is a nonsmoker. She states usually gets bronchitis 1-2 times a year.  Past Medical History  Diagnosis Date  . Sleep apnea   . Sleep apnea     uses cpap  . Anxiety     on pristiq  daily  . Morbid obesity with BMI of 50.0-59.9, adult ([facility]CC) 10/22/2013  . Anxiety    Past Surgical History  Procedure Laterality Date  . Tubal ligation     No family history on file. Social History  Substance Use Topics  . Smoking status: Never Smoker   . Smokeless tobacco: None  . Alcohol Use: No   OB History    No data available     Review of Systems  Respiratory: Positive for cough.   All other systems reviewed and are negative.     Allergies  Review of patient's allergies indicates no known allergies.  Home Medications   Prior to Admission medications   Medication Sig Start Date End Date Taking? Authorizing Provider  ALPRAZolam Prudy Feeler) 0.5 MG tablet Take 0.5 mg by mouth daily as needed for anxiety.    Historical Provider, MD  cyclobenzaprine (FLEXERIL) 5 MG tablet Take 1 tablet (5 mg total) by mouth 3 (three) times daily as needed for muscle spasms. 06/26/15   Shon Baton, MD  ferrous sulfate  325 (65 FE) MG tablet Take 325 mg by mouth daily with breakfast.    Historical Provider, MD  FLUoxetine (PROZAC) 40 MG capsule Take 40 mg by mouth daily.    Historical Provider, MD  HYDROcodone-acetaminophen (NORCO/VICODIN) 5-325 MG tablet Take 1-2 tablets by mouth every 6 (six) hours as needed. 08/03/15   Kristen N Ward, DO  ibuprofen (ADVIL,MOTRIN) 800 MG tablet Take 1 tablet (800 mg total) by mouth every 8 (eight) hours as needed for mild pain. 08/03/15   Kristen N Ward, DO  montelukast (SINGULAIR) 10 MG tablet Take 10 mg by mouth daily.    Historical Provider, MD  naproxen (NAPROSYN) 500 MG tablet Take 1 tablet (500 mg total) by mouth 2 (two) times daily. 06/26/15   Shon Baton, MD  traMADol (ULTRAM) 50 MG tablet Take 1 tablet (50 mg total) by mouth every 6 (six) hours as needed. 08/03/15   Kristen N Ward, DO   BP 140/95 mmHg  Pulse 85  Resp 22  Ht  (1.575 m)  Wt 310 lb (140.615 kg)  BMI 56.69 kg/m2  SpO2 95% Physical Exam  Nursing note and vitals reviewed.  Morbidly obese 39 year old female, resting comfortably and in no acute distress. Vital signs are significant for tachypnea and mild hypertension. Oxygen saturation is 95%, which is normal. Head is normocephalic and atraumatic. PERRLA, EOMI. Oropharynx is  clear. Neck is nontender and supple without adenopathy or JVD. Back is nontender and there is no CVA tenderness. Lungs have prolonged exhalation phase without overt rales, wheezes, or rhonchi. Chest is nontender. Heart has regular rate and rhythm without murmur. Abdomen is soft, flat, nontender without masses or hepatosplenomegaly and peristalsis is normoactive. Extremities have 1+ edema, full range of motion is present. Skin is warm and dry without rash. Neurologic: Mental status is normal, cranial nerves are intact, there are no motor or sensory deficits.  ED Course  Procedures (including critical care time)  Imaging Review Dg Chest 2 View  09/09/2015  CLINICAL  DATA:  Shortness of breath, cough, and wheezing this morning. EXAM: CHEST  2 VIEW COMPARISON:  02/27/2015 FINDINGS: The heart size and mediastinal contours are within normal limits. Both lungs are clear. The visualized skeletal structures are unremarkable. IMPRESSION: No active cardiopulmonary disease. Electronically Signed   By: Burman NievesWilliam  Stevens M.D.   On: 09/09/2015 03:13   I have personally reviewed and evaluated these images as part of my medical decision-making.   MDM   Final diagnoses:  Acute bronchitis, unspecified organism    Respiratory tract infection with some findings suggestive of bronchospasm. Old records reviewed in she does have a prior ED visit for acute bronchitis. Chest x-ray is obtained showing no evidence of pneumonia. She's given an albuterol with ipratropium nebulizer treatment with significant subjective improvement. Following this, there is improved air flow. She is given initial dose of prednisone and is discharged with prescription for prednisone. She is given an albuterol inhaler to take home.    Dione Boozeavid Cody Albus, MD 09/09/15 [id number]

## 2016-01-06 ENCOUNTER — Encounter (HOSPITAL_COMMUNITY): Payer: Self-pay

## 2016-01-06 ENCOUNTER — Emergency Department (HOSPITAL_COMMUNITY)
Admission: EM | Admit: 2016-01-06 | Discharge: 2016-01-07 | Disposition: A | Discharge status: Home / Self Care | Payer: Self-pay | Attending: Emergency Medicine | Admitting: Emergency Medicine

## 2016-01-06 DIAGNOSIS — S39012A Strain of muscle, fascia and tendon of lower back, initial encounter: Secondary | ICD-10-CM | POA: Insufficient documentation

## 2016-01-06 DIAGNOSIS — Y929 Unspecified place or not applicable: Secondary | ICD-10-CM | POA: Insufficient documentation

## 2016-01-06 DIAGNOSIS — X501XXA Overexertion from prolonged static or awkward postures, initial encounter: Secondary | ICD-10-CM | POA: Insufficient documentation

## 2016-01-06 DIAGNOSIS — Y9389 Activity, other specified: Secondary | ICD-10-CM | POA: Insufficient documentation

## 2016-01-06 DIAGNOSIS — Y999 Unspecified external cause status: Secondary | ICD-10-CM | POA: Insufficient documentation

## 2016-01-06 DIAGNOSIS — Z79899 Other long term (current) drug therapy: Secondary | ICD-10-CM | POA: Insufficient documentation

## 2016-01-06 MED ORDER — CYCLOBENZAPRINE HCL 5 MG PO TABS: 5.0000 mg | ORAL_TABLET | Freq: Three times a day (TID) | ORAL | Status: DC | PRN

## 2016-01-06 MED ORDER — OXYCODONE-ACETAMINOPHEN 5-325 MG PO TABS
1.0000 | ORAL_TABLET | Freq: Once | ORAL | Status: AC
  Administered 2016-01-06: 1 via ORAL
  Filled 2016-01-06: qty 1

## 2016-01-06 MED ORDER — CYCLOBENZAPRINE HCL 10 MG PO TABS
5.0000 mg | ORAL_TABLET | Freq: Once | ORAL | Status: AC
  Administered 2016-01-06: 5 mg via ORAL
  Filled 2016-01-06: qty 1

## 2016-01-06 MED ORDER — OXYCODONE-ACETAMINOPHEN 5-325 MG PO TABS: 1.0000 | ORAL_TABLET | Freq: Four times a day (QID) | ORAL | Status: DC | PRN

## 2016-01-06 MED ORDER — IBUPROFEN 800 MG PO TABS: 800.0000 mg | ORAL_TABLET | Freq: Three times a day (TID) | ORAL | Status: DC | PRN

## 2016-01-06 MED ORDER — IBUPROFEN 800 MG PO TABS
800.0000 mg | ORAL_TABLET | Freq: Once | ORAL | Status: AC
  Administered 2016-01-06: 800 mg via ORAL
  Filled 2016-01-06: qty 1

## 2016-01-06 MED ORDER — ONDANSETRON 4 MG PO TBDP
4.0000 mg | ORAL_TABLET | Freq: Once | ORAL | Status: AC
  Administered 2016-01-06: 4 mg via ORAL
  Filled 2016-01-06: qty 1

## 2016-01-06 NOTE — ED Notes (Signed)
Left sided back pain that started today. History of back problems.

## 2016-01-06 NOTE — ED Provider Notes (Signed)
By signing my name below, I, [name], attest that this documentation has been prepared under the direction and in the presence of Enbridge EnergyKristen N Ward, DO. Electronically Signed: [name], ED Scribe. 01/06/2016. 11:36 PM.  TIME SEEN: 11:25 PM  CHIEF COMPLAINT: Back Pain  HPI:  Ana Walker is a 40 y.o. female with history of obesity and intermittent back pain who presents to the Emergency Department complaining of sudden onset, constant, left lower back pain that began today. Pt reports that she was washing her car and when she stood up from being in a crouching position she felt immediate pain in her back - mostly on the left lower side. No fall or trauma to the back. Pt has been applying ice, Icy Hot, and taking 800 mg Ibuprofen without relief. The last time she took Ibuprofen was 4-5 hours ago. 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 She states that she usually tries to rest when she has flare ups. In the past when pt has had to come to the ED Flexeril has helped her pain. No previous back surgery or injections into back. Denies weakness, numbness, tingling, urinary or bowel incontinence, fever, or any other associated symptoms.   ROS: See HPI Constitutional: no fever  Eyes: no drainage  ENT: no runny nose   Cardiovascular:  no chest pain  Resp: no SOB  GI: no vomiting GU: no dysuria Integumentary: no rash  Allergy: no hives  Musculoskeletal: back pain. no leg swelling  Neurological: no slurred speech ROS otherwise negative  PAST MEDICAL HISTORY/PAST SURGICAL HISTORY:  Past Medical History  Diagnosis Date  . Sleep apnea   . Sleep apnea     uses cpap  . Anxiety     on pristiq 50mg  daily  . Morbid obesity with BMI of 50.0-59.9, adult (HCC) 10/22/2013  . Anxiety     MEDICATIONS:  Prior to Admission medications   Medication Sig Start Date End Date Taking? Authorizing Provider  ALPRAZolam Prudy Feeler(XANAX) 0.5 MG tablet Take 0.5 mg by mouth daily as needed  for anxiety.    Historical Provider, MD  cyclobenzaprine (FLEXERIL) 5 MG tablet Take 1 tablet (5 mg total) by mouth 3 (three) times daily as needed for muscle spasms. 06/26/15   Shon Batonourtney F Horton, MD  ferrous sulfate 325 (65 FE) MG tablet Take 325 mg by mouth daily with breakfast.    Historical Provider, MD  FLUoxetine (PROZAC) 40 MG capsule Take 40 mg by mouth daily.    Historical Provider, MD  HYDROcodone-acetaminophen (NORCO/VICODIN) 5-325 MG tablet Take 1-2 tablets by mouth every 6 (six) hours as needed. 08/03/15   Kristen N Ward, DO  ibuprofen (ADVIL,MOTRIN) 800 MG tablet Take 1 tablet (800 mg total) by mouth every 8 (eight) hours as needed for mild pain. 08/03/15   Kristen N Ward, DO  montelukast (SINGULAIR) 10 MG tablet Take 10 mg by mouth daily.    Historical Provider, MD  naproxen (NAPROSYN) 500 MG tablet Take 1 tablet (500 mg total) by mouth 2 (two) times daily. 06/26/15   Shon Batonourtney F Horton, MD  predniSONE (DELTASONE) 20 MG tablet Take 2 tablets (40 mg total) by mouth daily. 09/09/15   Dione Boozeavid Glick, MD  traMADol (ULTRAM) 50 MG tablet Take 1 tablet (50 mg total) by mouth every 6 (six) hours as needed. 08/03/15   Kristen N Ward, DO    ALLERGIES:  No Known Allergies  SOCIAL HISTORY:  Social History  Substance Use Topics  .  Smoking status: Never Smoker   . Smokeless tobacco: Not on file  . Alcohol Use: No    FAMILY HISTORY: No family history on file.  EXAM: BP 133/49 mmHg  Pulse 65  Temp(Src) 97.9 F (36.6 C) (Oral)  Resp 18  Ht  (1.575 m)  Wt 314 lb (142.429 kg)  BMI 57.42 kg/m2  SpO2 99%  LMP 12/30/2015 CONSTITUTIONAL: Alert and oriented and responds appropriately to questions. Well-appearing; well-nourished. Obese HEAD: Normocephalic EYES: Conjunctivae clear, PERRL ENT: normal nose; no rhinorrhea; moist mucous membranes NECK: Supple, no meningismus, no LAD  CARD: RRR; S1 and S2 appreciated; no murmurs, no clicks, no rubs, no gallops RESP: Normal chest excursion  without splinting or tachypnea; breath sounds clear and equal bilaterally; no wheezes, no rhonchi, no rales, no hypoxia or respiratory distress, speaking full sentences ABD/GI: Normal bowel sounds; non-distended; soft, non-tender, no rebound, no guarding, no peritoneal signs BACK:  The back appears normal and is TTP diffusely through lumbar paraspinal musculature worse on left side, no midline spinal tenderness, step off, or deformity, there is no CVA tenderness EXT: Normal ROM in all joints; non-tender to palpation; no edema; normal capillary refill; no cyanosis, no calf tenderness or swelling    SKIN: Normal color for age and race; warm; no rash NEURO: Moves all extremities equally, sensation to light touch intact diffusely, cranial nerves II through XII intact PSYCH: The patient's mood and manner are appropriate. Grooming and personal hygiene are appropriate.  MEDICAL DECISION MAKING: Patient here with lumbar strain, spasm. No midline tenderness. No red flag symptoms, neurologic deficits. Doubt cauda equina, spinal stenosis, epidural abscess or hematoma, discitis or transverse myelitis. Will treat pain with ibuprofen, Flexeril, Percocet. She reports she is early called her primary care physician's office and left a message to try to get an appointment tomorrow. Have advised her to continue moving but to not lift anything heavy, bending over for long periods of time. I recommend she continue heat therapy. I do not feel she needs emergent imaging of her back. She is comfortable with this plan. Discussed return precautions. We'll provide her with a work note.    At this time, I do not feel there is any life-threatening condition present. I have reviewed and discussed all results (EKG, imaging, lab, urine as appropriate), exam findings with patient. I have reviewed nursing notes and appropriate previous records.  I feel the patient is safe to be discharged home without further emergent workup. Discussed  usual and customary return precautions. Patient and family (if present) verbalize understanding and are comfortable with this plan.  Patient will follow-up with their primary care provider. If they do not have a primary care provider, information for follow-up has been provided to them. All questions have been answered.   I personally performed the services described in this documentation, which was scribed in my presence. The recorded information has been reviewed and is accurate.   Layla Maw Ward, DO 01/07/16 [phone]

## 2016-01-06 NOTE — Discharge Instructions (Signed)
Lumbosacral Strain Lumbosacral strain is a strain of any of the parts that make up your lumbosacral vertebrae. Your lumbosacral vertebrae are the bones that make up the lower third of your backbone. Your lumbosacral vertebrae are held together by muscles and tough, fibrous tissue (ligaments).  CAUSES  A sudden blow to your back can cause lumbosacral strain. Also, anything that causes an excessive stretch of the muscles in the low back can cause this strain. This is typically seen when people exert themselves strenuously, fall, lift heavy objects, bend, or crouch repeatedly. RISK FACTORS  Physically demanding work.  Participation in pushing or pulling sports or sports that require a sudden twist of the back (tennis, golf, baseball).  Weight lifting.  Excessive lower back curvature.  Forward-tilted pelvis.  Weak back or abdominal muscles or both.  Tight hamstrings. SIGNS AND SYMPTOMS  Lumbosacral strain may cause pain in the area of your injury or pain that moves (radiates) down your leg.  DIAGNOSIS Your health care provider can often diagnose lumbosacral strain through a physical exam. In some cases, you may need tests such as X-ray exams.  TREATMENT  Treatment for your lower back injury depends on many factors that your clinician will have to evaluate. However, most treatment will include the use of anti-inflammatory medicines. HOME CARE INSTRUCTIONS   Avoid hard physical activities (tennis, racquetball, waterskiing) if you are not in proper physical condition for it. This may aggravate or create problems.  If you have a back problem, avoid sports requiring sudden body movements. Swimming and walking are generally safer activities.  Maintain good posture.  Maintain a healthy weight.  For acute conditions, you may put ice on the injured area.  Put ice in a plastic bag.  Place a towel between your skin and the bag.  Leave the ice on for 20 minutes, 2-3 times a day.  When the  low back starts healing, stretching and strengthening exercises may be recommended. SEEK MEDICAL CARE IF:  Your back pain is getting worse.  You experience severe back pain not relieved with medicines. SEEK IMMEDIATE MEDICAL CARE IF:   You have numbness, tingling, weakness, or problems with the use of your arms or legs.  There is a change in bowel or bladder control.  You have increasing pain in any area of the body, including your belly (abdomen).  You notice shortness of breath, dizziness, or feel faint.  You feel sick to your stomach (nauseous), are throwing up (vomiting), or become sweaty.  You notice discoloration of your toes or legs, or your feet get very cold. MAKE SURE YOU:   Understand these instructions.  Will watch your condition.  Will get help right away if you are not doing well or get worse.   This information is not intended to replace advice given to you by your health care provider. Make sure you discuss any questions you have with your health care provider.   Document Released: 06/21/2005 Document Revised: 10/02/2014 Document Reviewed: 04/30/2013 Elsevier Interactive Patient Education 2016 Elsevier Inc.   [employer] Therapy Heat therapy can help ease sore, stiff, injured, and tight muscles and joints. Heat relaxes your muscles, which may help ease your pain.  RISKS AND COMPLICATIONS If you have any of the following conditions, do not use heat therapy unless your health care provider has approved:  Poor circulation.  Healing wounds or scarred skin in the area being treated.  Diabetes, heart disease, or high blood pressure.  Not being able to feel (numbness) the  area being treated.  Unusual swelling of the area being treated.  Active infections.  Blood clots.  Cancer.  Inability to communicate pain. This may include young children and people who have problems with their brain function (dementia).  Pregnancy. Heat therapy should only be used on  old, pre-existing, or long-lasting (chronic) injuries. Do not use heat therapy on new injuries unless directed by your health care provider. HOW TO USE HEAT THERAPY There are several different kinds of heat therapy, including:  Moist heat pack.  Warm water bath.  Hot water bottle.  Electric heating pad.  Heated gel pack.  Heated wrap.  Electric heating pad. Use the heat therapy method suggested by your health care provider. Follow your health care provider's instructions on when and how to use heat therapy. GENERAL HEAT THERAPY RECOMMENDATIONS  Do not sleep while using heat therapy. Only use heat therapy while you are awake.  Your skin may turn pink while using heat therapy. Do not use heat therapy if your skin turns red.  Do not use heat therapy if you have new pain.  High heat or long exposure to heat can cause burns. Be careful when using heat therapy to avoid burning your skin.  Do not use heat therapy on areas of your skin that are already irritated, such as with a rash or sunburn. SEEK MEDICAL CARE IF:  You have blisters, redness, swelling, or numbness.  You have new pain.  Your pain is worse. MAKE SURE YOU:  Understand these instructions.  Will watch your condition.  Will get help right away if you are not doing well or get worse.   This information is not intended to replace advice given to you by your health care provider. Make sure you discuss any questions you have with your health care provider.   Document Released: 12/04/2011 Document Revised: 10/02/2014 Document Reviewed: 11/04/2013 Elsevier Interactive Patient Education [employer]2016 Elsevier Inc.

## 2016-01-07 NOTE — ED Notes (Signed)
Pt states understanding of care given and follow up instructions 

## 2016-01-11 MED FILL — Oxycodone w/ Acetaminophen Tab 5-325 MG: ORAL | Qty: 6 | Status: AC

## 2016-10-06 ENCOUNTER — Other Ambulatory Visit (HOSPITAL_COMMUNITY): Payer: Self-pay | Admitting: Physician Assistant

## 2016-10-06 DIAGNOSIS — Z1231 Encounter for screening mammogram for malignant neoplasm of breast: Secondary | ICD-10-CM

## 2016-10-09 ENCOUNTER — Other Ambulatory Visit (HOSPITAL_BASED_OUTPATIENT_CLINIC_OR_DEPARTMENT_OTHER): Payer: Self-pay

## 2016-10-09 DIAGNOSIS — R5383 Other fatigue: Secondary | ICD-10-CM

## 2016-10-11 ENCOUNTER — Ambulatory Visit: Payer: Self-pay

## 2016-10-16 ENCOUNTER — Ambulatory Visit (HOSPITAL_COMMUNITY): Payer: Self-pay

## 2016-10-22 ENCOUNTER — Ambulatory Visit: Payer: Commercial Managed Care - PPO | Attending: Family Medicine | Admitting: Neurology

## 2016-10-22 DIAGNOSIS — G4761 Periodic limb movement disorder: Secondary | ICD-10-CM | POA: Insufficient documentation

## 2016-10-22 DIAGNOSIS — Z79899 Other long term (current) drug therapy: Secondary | ICD-10-CM | POA: Insufficient documentation

## 2016-10-22 DIAGNOSIS — G4730 Sleep apnea, unspecified: Secondary | ICD-10-CM | POA: Diagnosis present

## 2016-10-22 DIAGNOSIS — R5383 Other fatigue: Secondary | ICD-10-CM

## 2016-10-22 DIAGNOSIS — G4733 Obstructive sleep apnea (adult) (pediatric): Secondary | ICD-10-CM | POA: Diagnosis not present

## 2016-10-25 ENCOUNTER — Ambulatory Visit (HOSPITAL_COMMUNITY)
Admission: RE | Admit: 2016-10-25 | Discharge: 2016-10-25 | Disposition: A | Discharge status: Home / Self Care | Payer: Commercial Managed Care - PPO | Source: Ambulatory Visit | Attending: Physician Assistant | Admitting: Physician Assistant

## 2016-10-25 DIAGNOSIS — Z1231 Encounter for screening mammogram for malignant neoplasm of breast: Secondary | ICD-10-CM | POA: Diagnosis present

## 2016-10-28 NOTE — Procedures (Signed)
[facility] Mansour Balboa A. Gerilyn Pilgrimoonquah, MD     [email or web address]             NOCTURNAL POLYSOMNOGRAPHY   LOCATION: [facility]   Patient Name: Ana Walker, Ana Walker Study Date: 10/22/2016 Gender: Female D.O.B: [birth year] Age (years): 40 Referring Provider: Estanislado PandyPaul W. Sasser Height (inches): 62 Interpreting Physician: Beryle BeamsKofi Annikah Lovins MD, ABSM Weight (lbs): 336 RPSGT: Alfonso EllisHedrick, Debra BMI: 61 MRN: [id number] Neck Size: 17.50 CLINICAL INFORMATION Sleep Study Type: NPSG  Indication for sleep study: N/A  Epworth Sleepiness Score: 4  SLEEP STUDY TECHNIQUE As per the AASM Manual for the Scoring of Sleep and Associated Events v2.3 (April 2016) with a hypopnea requiring 4% desaturations.  The channels recorded and monitored were frontal, central and occipital EEG, electrooculogram (EOG), submentalis EMG (chin), nasal and oral airflow, thoracic and abdominal wall motion, anterior tibialis EMG, snore microphone, electrocardiogram, and pulse oximetry.  MEDICATIONS Medications self-administered by patient taken the night of the study : N/A  Current Outpatient Prescriptions:  .  acetaminophen (TYLENOL) 500 MG tablet, Take 1,000 mg by mouth every 6 (six) hours as needed for mild pain or moderate pain., Disp: , Rfl:  .  ALPRAZolam (XANAX) 0.5 MG tablet, Take 0.5 mg by mouth daily as needed for anxiety., Disp: , Rfl:  .  cyclobenzaprine (FLEXERIL) 5 MG tablet, Take 1-2 tablets (5-10 mg total) by mouth 3 (three) times daily as needed for muscle spasms., Disp: 15 tablet, Rfl: 0 .  FLUoxetine (PROZAC) 40 MG capsule, Take 40 mg by mouth daily., Disp: , Rfl:  .  ibuprofen (ADVIL,MOTRIN) 800 MG tablet, Take 1 tablet (800 mg total) by mouth every 8 (eight) hours as needed for mild pain., Disp: 30 tablet, Rfl: 0 .  Liniments (DEEP BLUE RELIEF) GEL, Apply 1 application topically daily as needed (for back pain)., Disp: , Rfl:  .  oxyCODONE-acetaminophen (PERCOCET/ROXICET) 5-325 MG tablet, Take 1-2 tablets  by mouth every 6 (six) hours as needed., Disp: 6 tablet, Rfl: 0   SLEEP ARCHITECTURE The study was initiated at 10:39:12 PM and ended at 4:53:15 AM.  Sleep onset time was 104.8 minutes and the sleep efficiency was 50.1%. The total sleep time was 187.5 minutes.  Stage REM latency was N/A minutes.  The patient spent 10.13% of the night in stage N1 sleep, 85.07% in stage N2 sleep, 4.80% in stage N3 and 0.00% in REM.  Alpha intrusion was absent.  Supine sleep was 0.00%.  RESPIRATORY PARAMETERS The overall apnea/hypopnea index (AHI) was 25.3 per hour. There were 8 total apneas, including 8 obstructive, 0 central and 0 mixed apneas. There were 71 hypopneas and 18 RERAs.  The AHI during Stage REM sleep was N/A per hour.  AHI while supine was N/A per hour.  The mean oxygen saturation was 95.03%. The minimum SpO2 during sleep was 88.00%.  Loud snoring was noted during this study.  CARDIAC DATA The 2 lead EKG demonstrated sinus rhythm. The mean heart rate was N/A beats per minute. Other EKG findings include: None. LEG MOVEMENT DATA The total PLMS were 127 with a resulting PLMS index of 40.64. Associated arousal with leg movement index was 10.6.  IMPRESSIONS - Moderate obstructive sleep apnea was observed during this recording. A formal CPAP titration recording is suggested. - Severe periodic limb movements of sleep occurred during the study. Associated arousals were significant. - Abnormal sleep architecture is observed with a reduced sleep efficiency, reduced slow wave sleep and absent REM sleep.  Ana RammingKofi A Yukari Flax, MD Diplomate, American Board of Sleep  Medicine.

## 2017-04-25 DIAGNOSIS — R079 Chest pain, unspecified: Secondary | ICD-10-CM | POA: Diagnosis not present

## 2017-05-03 DIAGNOSIS — R079 Chest pain, unspecified: Secondary | ICD-10-CM | POA: Diagnosis not present

## 2017-05-03 DIAGNOSIS — F411 Generalized anxiety disorder: Secondary | ICD-10-CM | POA: Diagnosis not present

## 2017-05-03 DIAGNOSIS — Z6841 Body Mass Index (BMI) 40.0 and over, adult: Secondary | ICD-10-CM | POA: Diagnosis not present

## 2017-05-03 DIAGNOSIS — E669 Obesity, unspecified: Secondary | ICD-10-CM | POA: Diagnosis not present

## 2017-05-08 DIAGNOSIS — G4733 Obstructive sleep apnea (adult) (pediatric): Secondary | ICD-10-CM | POA: Diagnosis not present

## 2017-05-09 ENCOUNTER — Other Ambulatory Visit (HOSPITAL_COMMUNITY): Payer: Self-pay | Admitting: General Surgery

## 2017-05-13 DIAGNOSIS — J209 Acute bronchitis, unspecified: Secondary | ICD-10-CM | POA: Diagnosis not present

## 2017-05-13 DIAGNOSIS — R05 Cough: Secondary | ICD-10-CM | POA: Diagnosis not present

## 2017-05-13 DIAGNOSIS — H00014 Hordeolum externum left upper eyelid: Secondary | ICD-10-CM | POA: Diagnosis not present

## 2017-05-16 ENCOUNTER — Encounter: Payer: BLUE CROSS/BLUE SHIELD | Attending: General Surgery | Admitting: Skilled Nursing Facility1

## 2017-05-16 ENCOUNTER — Encounter: Payer: Self-pay | Admitting: Skilled Nursing Facility1

## 2017-05-16 DIAGNOSIS — Z6841 Body Mass Index (BMI) 40.0 and over, adult: Secondary | ICD-10-CM | POA: Insufficient documentation

## 2017-05-16 DIAGNOSIS — G4733 Obstructive sleep apnea (adult) (pediatric): Secondary | ICD-10-CM | POA: Diagnosis not present
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
## 2017-05-24 ENCOUNTER — Other Ambulatory Visit: Payer: Self-pay

## 2017-05-24 ENCOUNTER — Ambulatory Visit (HOSPITAL_COMMUNITY)
Admission: RE | Admit: 2017-05-24 | Discharge: 2017-05-24 | Disposition: A | Discharge status: Home / Self Care | Payer: BLUE CROSS/BLUE SHIELD | Source: Ambulatory Visit | Attending: General Surgery | Admitting: General Surgery

## 2017-05-24 DIAGNOSIS — I498 Other specified cardiac arrhythmias: Secondary | ICD-10-CM | POA: Insufficient documentation

## 2017-05-24 DIAGNOSIS — Z6841 Body Mass Index (BMI) 40.0 and over, adult: Secondary | ICD-10-CM | POA: Diagnosis not present

## 2017-05-24 DIAGNOSIS — Z01818 Encounter for other preprocedural examination: Secondary | ICD-10-CM | POA: Diagnosis not present

## 2017-05-29 DIAGNOSIS — G4733 Obstructive sleep apnea (adult) (pediatric): Secondary | ICD-10-CM | POA: Diagnosis not present

## 2017-05-30 DIAGNOSIS — F411 Generalized anxiety disorder: Secondary | ICD-10-CM | POA: Diagnosis not present

## 2017-05-30 DIAGNOSIS — E669 Obesity, unspecified: Secondary | ICD-10-CM | POA: Diagnosis not present

## 2017-05-30 DIAGNOSIS — Z6841 Body Mass Index (BMI) 40.0 and over, adult: Secondary | ICD-10-CM | POA: Diagnosis not present

## 2017-06-13 DIAGNOSIS — F4323 Adjustment disorder with mixed anxiety and depressed mood: Secondary | ICD-10-CM | POA: Diagnosis not present

## 2017-06-14 ENCOUNTER — Encounter: Payer: Self-pay | Admitting: Skilled Nursing Facility1

## 2017-06-14 ENCOUNTER — Encounter: Payer: BLUE CROSS/BLUE SHIELD | Attending: General Surgery | Admitting: Skilled Nursing Facility1

## 2017-06-14 DIAGNOSIS — Z6841 Body Mass Index (BMI) 40.0 and over, adult: Secondary | ICD-10-CM | POA: Diagnosis not present

## 2017-06-14 DIAGNOSIS — G4733 Obstructive sleep apnea (adult) (pediatric): Secondary | ICD-10-CM | POA: Insufficient documentation
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
## 2017-06-19 DIAGNOSIS — F4323 Adjustment disorder with mixed anxiety and depressed mood: Secondary | ICD-10-CM | POA: Diagnosis not present

## 2017-06-26 ENCOUNTER — Ambulatory Visit (INDEPENDENT_AMBULATORY_CARE_PROVIDER_SITE_OTHER): Payer: BLUE CROSS/BLUE SHIELD | Admitting: Psychiatry

## 2017-06-26 DIAGNOSIS — F509 Eating disorder, unspecified: Secondary | ICD-10-CM | POA: Diagnosis not present

## 2017-07-03 DIAGNOSIS — F4323 Adjustment disorder with mixed anxiety and depressed mood: Secondary | ICD-10-CM | POA: Diagnosis not present

## 2017-07-10 DIAGNOSIS — F4323 Adjustment disorder with mixed anxiety and depressed mood: Secondary | ICD-10-CM | POA: Diagnosis not present

## 2017-07-12 ENCOUNTER — Encounter: Payer: Self-pay | Admitting: Skilled Nursing Facility1

## 2017-07-12 ENCOUNTER — Encounter: Payer: BLUE CROSS/BLUE SHIELD | Attending: General Surgery | Admitting: Skilled Nursing Facility1

## 2017-07-12 DIAGNOSIS — G4733 Obstructive sleep apnea (adult) (pediatric): Secondary | ICD-10-CM | POA: Insufficient documentation

## 2017-07-12 DIAGNOSIS — Z6841 Body Mass Index (BMI) 40.0 and over, adult: Secondary | ICD-10-CM | POA: Diagnosis not present
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
## 2017-07-17 DIAGNOSIS — F4323 Adjustment disorder with mixed anxiety and depressed mood: Secondary | ICD-10-CM | POA: Diagnosis not present

## 2017-07-24 DIAGNOSIS — F4323 Adjustment disorder with mixed anxiety and depressed mood: Secondary | ICD-10-CM | POA: Diagnosis not present

## 2017-07-31 DIAGNOSIS — F4323 Adjustment disorder with mixed anxiety and depressed mood: Secondary | ICD-10-CM | POA: Diagnosis not present

## 2017-08-07 DIAGNOSIS — F4323 Adjustment disorder with mixed anxiety and depressed mood: Secondary | ICD-10-CM | POA: Diagnosis not present

## 2017-08-11 DIAGNOSIS — G4733 Obstructive sleep apnea (adult) (pediatric): Secondary | ICD-10-CM | POA: Diagnosis not present

## 2017-08-13 ENCOUNTER — Encounter: Payer: Self-pay | Admitting: Skilled Nursing Facility1

## 2017-08-13 ENCOUNTER — Encounter: Payer: BLUE CROSS/BLUE SHIELD | Attending: General Surgery | Admitting: Skilled Nursing Facility1

## 2017-08-13 DIAGNOSIS — Z6841 Body Mass Index (BMI) 40.0 and over, adult: Secondary | ICD-10-CM | POA: Insufficient documentation

## 2017-08-13 DIAGNOSIS — G4733 Obstructive sleep apnea (adult) (pediatric): Secondary | ICD-10-CM | POA: Diagnosis not present
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
## 2017-08-21 DIAGNOSIS — F4323 Adjustment disorder with mixed anxiety and depressed mood: Secondary | ICD-10-CM | POA: Diagnosis not present

## 2017-08-22 DIAGNOSIS — R21 Rash and other nonspecific skin eruption: Secondary | ICD-10-CM | POA: Diagnosis not present

## 2017-08-22 DIAGNOSIS — Z6841 Body Mass Index (BMI) 40.0 and over, adult: Secondary | ICD-10-CM | POA: Diagnosis not present

## 2017-08-27 DIAGNOSIS — Z6841 Body Mass Index (BMI) 40.0 and over, adult: Secondary | ICD-10-CM | POA: Diagnosis not present

## 2017-08-27 DIAGNOSIS — E669 Obesity, unspecified: Secondary | ICD-10-CM | POA: Diagnosis not present

## 2017-08-27 DIAGNOSIS — F411 Generalized anxiety disorder: Secondary | ICD-10-CM | POA: Diagnosis not present

## 2017-09-06 DIAGNOSIS — Z6841 Body Mass Index (BMI) 40.0 and over, adult: Secondary | ICD-10-CM | POA: Diagnosis not present

## 2017-09-06 DIAGNOSIS — K649 Unspecified hemorrhoids: Secondary | ICD-10-CM | POA: Diagnosis not present

## 2017-09-06 DIAGNOSIS — K625 Hemorrhage of anus and rectum: Secondary | ICD-10-CM | POA: Diagnosis not present

## 2017-09-24 DIAGNOSIS — K625 Hemorrhage of anus and rectum: Secondary | ICD-10-CM | POA: Diagnosis not present

## 2017-10-02 DIAGNOSIS — F4323 Adjustment disorder with mixed anxiety and depressed mood: Secondary | ICD-10-CM | POA: Diagnosis not present

## 2017-10-08 ENCOUNTER — Encounter: Payer: BLUE CROSS/BLUE SHIELD | Attending: General Surgery | Admitting: Skilled Nursing Facility1

## 2017-10-08 DIAGNOSIS — Z6841 Body Mass Index (BMI) 40.0 and over, adult: Secondary | ICD-10-CM

## 2017-10-08 DIAGNOSIS — G4733 Obstructive sleep apnea (adult) (pediatric): Secondary | ICD-10-CM | POA: Diagnosis not present

## 2017-10-09 ENCOUNTER — Ambulatory Visit (INDEPENDENT_AMBULATORY_CARE_PROVIDER_SITE_OTHER): Payer: BLUE CROSS/BLUE SHIELD | Admitting: Psychiatry

## 2017-10-09 DIAGNOSIS — F509 Eating disorder, unspecified: Secondary | ICD-10-CM | POA: Diagnosis not present

## 2017-10-10 ENCOUNTER — Encounter: Payer: Self-pay | Admitting: Skilled Nursing Facility1

## 2017-10-10 NOTE — Progress Notes (Signed)
Pre-Operative Nutrition Class:  Appt start time: 0449   End time:  1830.  Patient was seen on 10/08/2017 for Pre-Operative Bariatric Surgery Education at [facility].   Surgery date:  Surgery type: sleeve gastrectomy  Start weight at [facility]: 348 Weight today: 346.1  Samples given per MNT protocol. Patient educated on appropriate usage: Bariatric Advantage Multivitamin Lot # [id number] Exp: 03/2018  Bariatric Advantage Calcium  Lot # [id number] Exp:06-02-18  Unjury Protein Shake Lot # [id number] Exp: 07-07-18  The following the learning objectives were met by the patient during this course:  Identify Pre-Op Dietary Goals and will begin 2 weeks pre-operatively  Identify appropriate sources of fluids and proteins   State protein recommendations and appropriate sources pre and post-operatively  Identify Post-Operative Dietary Goals and will follow for 2 weeks post-operatively  Identify appropriate multivitamin and calcium sources  Describe the need for physical activity post-operatively and will follow MD recommendations  State when to call healthcare provider regarding medication questions or post-operative complications  Handouts given during class include:  Pre-Op Bariatric Surgery Diet Handout  Protein Shake Handout  Post-Op Bariatric Surgery Nutrition Handout  BELT Program Information Flyer  Support Group Information Flyer  [facility] Bariatric Supplements Price List  Follow[facility] Plan: [facility] diet [facility] per MD.

## 2017-10-16 DIAGNOSIS — F4323 Adjustment disorder with mixed anxiety and depressed mood: Secondary | ICD-10-CM | POA: Diagnosis not present

## 2017-10-22 DIAGNOSIS — J209 Acute bronchitis, unspecified: Secondary | ICD-10-CM | POA: Diagnosis not present

## 2017-10-22 DIAGNOSIS — Z6841 Body Mass Index (BMI) 40.0 and over, adult: Secondary | ICD-10-CM | POA: Diagnosis not present

## 2017-10-22 DIAGNOSIS — J019 Acute sinusitis, unspecified: Secondary | ICD-10-CM | POA: Diagnosis not present

## 2017-10-22 DIAGNOSIS — G4733 Obstructive sleep apnea (adult) (pediatric): Secondary | ICD-10-CM | POA: Diagnosis not present

## 2017-10-26 DIAGNOSIS — G4733 Obstructive sleep apnea (adult) (pediatric): Secondary | ICD-10-CM | POA: Diagnosis not present

## 2017-11-08 DIAGNOSIS — K644 Residual hemorrhoidal skin tags: Secondary | ICD-10-CM | POA: Diagnosis not present

## 2017-11-08 DIAGNOSIS — G4730 Sleep apnea, unspecified: Secondary | ICD-10-CM | POA: Diagnosis not present

## 2017-11-08 DIAGNOSIS — F419 Anxiety disorder, unspecified: Secondary | ICD-10-CM | POA: Diagnosis not present

## 2017-11-08 DIAGNOSIS — K640 First degree hemorrhoids: Secondary | ICD-10-CM | POA: Diagnosis not present

## 2017-11-08 DIAGNOSIS — K625 Hemorrhage of anus and rectum: Secondary | ICD-10-CM | POA: Diagnosis not present

## 2017-11-14 DIAGNOSIS — E669 Obesity, unspecified: Secondary | ICD-10-CM | POA: Diagnosis not present

## 2017-11-14 DIAGNOSIS — Z6841 Body Mass Index (BMI) 40.0 and over, adult: Secondary | ICD-10-CM | POA: Diagnosis not present

## 2017-11-28 DIAGNOSIS — Z6841 Body Mass Index (BMI) 40.0 and over, adult: Secondary | ICD-10-CM | POA: Diagnosis not present

## 2017-11-28 DIAGNOSIS — E669 Obesity, unspecified: Secondary | ICD-10-CM | POA: Diagnosis not present

## 2017-12-11 DIAGNOSIS — F4323 Adjustment disorder with mixed anxiety and depressed mood: Secondary | ICD-10-CM | POA: Diagnosis not present

## 2017-12-12 DIAGNOSIS — G4733 Obstructive sleep apnea (adult) (pediatric): Secondary | ICD-10-CM | POA: Diagnosis not present

## 2017-12-18 NOTE — Patient Instructions (Addendum)
Ana Walker  12/18/2017   Your procedure is scheduled on: Monday 12/24/2017  [facility]              Report to admitting at  0745  AM    Call this number if you have problems the morning of surgery [phone]     Please bring CPAP mask and tubing with you to hospital morning of surgery!               Remember: Do not eat food  :After Midnight.   NO SOLID FOOD AFTER MIDNIGHT THE NIGHT PRIOR TO SURGERY. NOTHING BY MOUTH EXCEPT CLEAR LIQUIDS UNTIL 3 HOURS PRIOR TO SCHEULED SURGERY. PLEASE FINISH ENSURE DRINK PER SURGEON ORDER 3 HOURS PRIOR TO SCHEDULED SURGERY TIME WHICH NEEDS TO BE COMPLETED AT   0645 am.    CLEAR LIQUID DIET   Foods Allowed                                                                     Foods Excluded  Coffee and tea, regular and decaf                             liquids that you cannot  Plain Jell-O in any flavor                                             see through such as: Fruit ices (not with fruit pulp)                                     milk, soups, orange juice  Iced Popsicles                                    All solid food Carbonated beverages, regular and diet                                    Cranberry, grape and apple juices Sports drinks like Gatorade Lightly seasoned clear broth or consume(fat free) Sugar, honey syrup  Sample Menu Breakfast                                Lunch                                     Supper Cranberry juice                    Beef broth  Chicken broth Jell-O                                     Grape juice                           Apple juice Coffee or tea                        Jell-O                                      Popsicle                                                Coffee or tea                        Coffee or tea  _____________________________________________________________________    Take these medicines  the morning of surgery with A SIP OF WATER: Fluoxetine (Paxil), Montelukast (Singulair), Alprazolam( Xanax)                                You may not have any metal on your body including hair pins and              piercings  Do not wear jewelry, make-up, lotions, powders or perfumes, deodorant             Do not wear nail polish.  Do not shave  48 hours prior to surgery.              Men may shave face and neck.   Do not bring valuables to the hospital. [facility] IS NOT             RESPONSIBLE   FOR VALUABLES.  Contacts, dentures or bridgework may not be worn into surgery.  Leave suitcase in the car. After surgery it may be brought to your room.                  Please read over the following fact sheets you were given: _____________________________________________________________________             [facility] - Preparing for Surgery Before surgery, you can play an important role.  Because skin is not sterile, your skin needs to be as free of germs as possible.  You can reduce the number of germs on your skin by washing with CHG (chlorahexidine gluconate) soap before surgery.  CHG is an antiseptic cleaner which kills germs and bonds with the skin to continue killing germs even after washing. Please DO NOT use if you have an allergy to CHG or antibacterial soaps.  If your skin becomes reddened/irritated stop using the CHG and inform your nurse when you arrive at Short Stay. Do not shave (including legs and underarms) for at least 48 hours prior to the first CHG shower.  You may shave your face/neck. Please follow these instructions carefully:  1.  Shower with CHG Soap the night before surgery and the  morning of Surgery.  2.  If you choose to wash your hair, wash your hair first as usual with your  normal  shampoo.  3.  After you shampoo, rinse your hair and body thoroughly to remove the  shampoo.                           4.  Use CHG as you would any other liquid soap.  You can  apply chg directly  to the skin and wash                       Gently with a scrungie or clean washcloth.  5.  Apply the CHG Soap to your body ONLY FROM THE NECK DOWN.   Do not use on face/ open                           Wound or open sores. Avoid contact with eyes, ears mouth and genitals (private parts).                       Wash face,  Genitals (private parts) with your normal soap.             6.  Wash thoroughly, paying special attention to the area where your surgery  will be performed.  7.  Thoroughly rinse your body with warm water from the neck down.  8.  DO NOT shower/wash with your normal soap after using and rinsing off  the CHG Soap.                9.  Pat yourself dry with a clean towel.            10.  Wear clean pajamas.            11.  Place clean sheets on your bed the night of your first shower and do not  sleep with pets. Day of Surgery : Do not apply any lotions/deodorants the morning of surgery.  Please wear clean clothes to the hospital/surgery center.  FAILURE TO FOLLOW THESE INSTRUCTIONS MAY RESULT IN THE CANCELLATION OF YOUR SURGERY PATIENT SIGNATURE_________________________________  NURSE SIGNATURE__________________________________  ________________________________________________________________________   Ana Walker  An incentive spirometer is a tool that can help keep your lungs clear and active. This tool measures how well you are filling your lungs with each breath. Taking long deep breaths may help reverse or decrease the chance of developing breathing (pulmonary) problems (especially infection) following:  A long period of time when you are unable to move or be active. BEFORE THE PROCEDURE   If the spirometer includes an indicator to show your best effort, your nurse or respiratory therapist will set it to a desired goal.  If possible, sit up straight or lean slightly forward. Try not to slouch.  Hold the incentive spirometer in an upright  position. INSTRUCTIONS FOR USE  1. Sit on the edge of your bed if possible, or sit up as far as you can in bed or on a chair. 2. Hold the incentive spirometer in an upright position. 3. Breathe out normally. 4. Place the mouthpiece in your mouth and seal your lips tightly around it. 5. Breathe in slowly and as deeply as possible, raising the piston or the ball toward the top of the column. 6. Hold your breath for 3-5 seconds or for as long as possible. Allow the piston or ball  to fall to the bottom of the column. 7. Remove the mouthpiece from your mouth and breathe out normally. 8. Rest for a few seconds and repeat Steps 1 through 7 at least 10 times every 1-2 hours when you are awake. Take your time and take a few normal breaths between deep breaths. 9. The spirometer may include an indicator to show your best effort. Use the indicator as a goal to work toward during each repetition. 10. After each set of 10 deep breaths, practice coughing to be sure your lungs are clear. If you have an incision (the cut made at the time of surgery), support your incision when coughing by placing a pillow or rolled up towels firmly against it. Once you are able to get out of bed, walk around indoors and cough well. You may stop using the incentive spirometer when instructed by your caregiver.  RISKS AND COMPLICATIONS  Take your time so you do not get dizzy or light-headed.  If you are in pain, you may need to take or ask for pain medication before doing incentive spirometry. It is harder to take a deep breath if you are having pain. AFTER USE  Rest and breathe slowly and easily.  It can be helpful to keep track of a log of your progress. Your caregiver can provide you with a simple table to help with this. If you are using the spirometer at home, follow these instructions: SEEK MEDICAL CARE IF:   You are having difficultly using the spirometer.  You have trouble using the spirometer as often as  instructed.  Your pain medication is not giving enough relief while using the spirometer.  You develop fever of 100.5 F (38.1 C) or higher. SEEK IMMEDIATE MEDICAL CARE IF:   You cough up bloody sputum that had not been present before.  You develop fever of 102 F (38.9 C) or greater.  You develop worsening pain at or near the incision site. MAKE SURE YOU:   Understand these instructions.  Will watch your condition.  Will get help right away if you are not doing well or get worse. Document Released: 01/22/2007 Document Revised: 12/04/2011 Document Reviewed: 03/25/2007 [facility] Patient Information 2014 [place], [place].   ________________________________________________________________________

## 2017-12-18 NOTE — Progress Notes (Signed)
05/24/2017- noted in Epic-EKG and CXR

## 2017-12-19 ENCOUNTER — Other Ambulatory Visit: Payer: Self-pay

## 2017-12-19 ENCOUNTER — Encounter (HOSPITAL_COMMUNITY)
Admission: RE | Admit: 2017-12-19 | Discharge: 2017-12-19 | Disposition: A | Discharge status: Home / Self Care | Payer: BLUE CROSS/BLUE SHIELD | Source: Ambulatory Visit | Attending: General Surgery | Admitting: General Surgery

## 2017-12-19 ENCOUNTER — Encounter (HOSPITAL_COMMUNITY): Payer: Self-pay

## 2017-12-19 DIAGNOSIS — Z01812 Encounter for preprocedural laboratory examination: Secondary | ICD-10-CM | POA: Diagnosis not present

## 2017-12-19 LAB — BASIC METABOLIC PANEL
ANION GAP: 10 (ref 5–15)
BUN: 14 mg/dL (ref 6–20)
CALCIUM: 9.1 mg/dL (ref 8.9–10.3)
CO2: 26 mmol/L (ref 22–32)
Chloride: 102 mmol/L (ref 101–111)
Creatinine, Ser: 0.69 mg/dL (ref 0.44–1.00)
GLUCOSE: 141 mg/dL — AB (ref 65–99)
POTASSIUM: 3.9 mmol/L (ref 3.5–5.1)
SODIUM: 138 mmol/L (ref 135–145)

## 2017-12-19 LAB — CBC
HEMATOCRIT: 37.8 % (ref 36.0–46.0)
HEMOGLOBIN: 11.7 g/dL — AB (ref 12.0–15.0)
MCH: 29.3 pg (ref 26.0–34.0)
MCHC: 31 g/dL (ref 30.0–36.0)
MCV: 94.5 fL (ref 78.0–100.0)
Platelets: 237 10*3/uL (ref 150–400)
RBC: 4 MIL/uL (ref 3.87–5.11)
RDW: 14.3 % (ref 11.5–15.5)
WBC: 9.2 10*3/uL (ref 4.0–10.5)

## 2017-12-19 LAB — HCG, SERUM, QUALITATIVE: PREG SERUM: NEGATIVE

## 2017-12-19 NOTE — Progress Notes (Signed)
Please place orders in Epic as patient has surgery on Monday 12/24/2017! Thank you!

## 2017-12-24 ENCOUNTER — Other Ambulatory Visit: Payer: Self-pay

## 2017-12-24 ENCOUNTER — Encounter (HOSPITAL_COMMUNITY)
Admission: RE | Disposition: A | Discharge status: Home / Self Care | Payer: Self-pay | Source: Ambulatory Visit | Attending: General Surgery

## 2017-12-24 ENCOUNTER — Encounter (HOSPITAL_COMMUNITY): Payer: Self-pay | Admitting: *Deleted

## 2017-12-24 ENCOUNTER — Inpatient Hospital Stay (HOSPITAL_COMMUNITY): Payer: BLUE CROSS/BLUE SHIELD | Admitting: Anesthesiology

## 2017-12-24 ENCOUNTER — Inpatient Hospital Stay (HOSPITAL_COMMUNITY)
Admission: RE | Admit: 2017-12-24 | Discharge: 2017-12-25 | DRG: 621 | Disposition: A | Discharge status: Home / Self Care | Payer: BLUE CROSS/BLUE SHIELD | Source: Ambulatory Visit | Attending: General Surgery | Admitting: General Surgery

## 2017-12-24 DIAGNOSIS — Z6841 Body Mass Index (BMI) 40.0 and over, adult: Secondary | ICD-10-CM | POA: Diagnosis not present

## 2017-12-24 DIAGNOSIS — G4730 Sleep apnea, unspecified: Secondary | ICD-10-CM | POA: Diagnosis not present

## 2017-12-24 DIAGNOSIS — G4733 Obstructive sleep apnea (adult) (pediatric): Secondary | ICD-10-CM | POA: Diagnosis not present

## 2017-12-24 DIAGNOSIS — K2950 Unspecified chronic gastritis without bleeding: Secondary | ICD-10-CM | POA: Diagnosis not present

## 2017-12-24 DIAGNOSIS — F419 Anxiety disorder, unspecified: Secondary | ICD-10-CM | POA: Diagnosis not present

## 2017-12-24 HISTORY — PX: LAPAROSCOPIC GASTRIC SLEEVE RESECTION: SHX5895

## 2017-12-24 LAB — HEMOGLOBIN AND HEMATOCRIT, BLOOD
HEMATOCRIT: 37.6 % (ref 36.0–46.0)
HEMOGLOBIN: 12 g/dL (ref 12.0–15.0)

## 2017-12-24 LAB — TYPE AND SCREEN
ABO/RH(D): O POS
Antibody Screen: NEGATIVE

## 2017-12-24 LAB — ABO/RH: ABO/RH(D): O POS

## 2017-12-24 SURGERY — GASTRECTOMY, SLEEVE, LAPAROSCOPIC
Anesthesia: General

## 2017-12-24 MED ORDER — BUPIVACAINE LIPOSOME 1.3 % IJ SUSP
20.0000 mL | Freq: Once | INTRAMUSCULAR | Status: AC
  Administered 2017-12-24: 20 mL
  Filled 2017-12-24: qty 20

## 2017-12-24 MED ORDER — BUPIVACAINE HCL 0.25 % IJ SOLN
INTRAMUSCULAR | Status: DC | PRN
  Administered 2017-12-24: 30 mL

## 2017-12-24 MED ORDER — LIDOCAINE 2% (20 MG/ML) 5 ML SYRINGE
INTRAMUSCULAR | Status: DC | PRN
  Administered 2017-12-24: 100 mg via INTRAVENOUS

## 2017-12-24 MED ORDER — PROMETHAZINE HCL 25 MG/ML IJ SOLN
6.2500 mg | INTRAMUSCULAR | Status: DC | PRN
  Administered 2017-12-24: 12.5 mg via INTRAVENOUS

## 2017-12-24 MED ORDER — ONDANSETRON HCL 4 MG/2ML IJ SOLN
4.0000 mg | INTRAMUSCULAR | Status: DC | PRN
  Administered 2017-12-24: 4 mg via INTRAVENOUS
  Filled 2017-12-24: qty 2

## 2017-12-24 MED ORDER — FENTANYL CITRATE (PF) 100 MCG/2ML IJ SOLN
INTRAMUSCULAR | Status: AC
  Filled 2017-12-24: qty 2

## 2017-12-24 MED ORDER — SUCCINYLCHOLINE CHLORIDE 200 MG/10ML IV SOSY
PREFILLED_SYRINGE | INTRAVENOUS | Status: DC | PRN
  Administered 2017-12-24: 160 mg via INTRAVENOUS

## 2017-12-24 MED ORDER — APREPITANT 40 MG PO CAPS
40.0000 mg | ORAL_CAPSULE | ORAL | Status: AC
  Administered 2017-12-24: 40 mg via ORAL
  Filled 2017-12-24: qty 1

## 2017-12-24 MED ORDER — SODIUM CHLORIDE 0.9 % IV SOLN
INTRAVENOUS | Status: DC
  Administered 2017-12-24 (×2): via INTRAVENOUS
  Administered 2017-12-25: 1000 mL via INTRAVENOUS

## 2017-12-24 MED ORDER — LIDOCAINE 2% (20 MG/ML) 5 ML SYRINGE
INTRAMUSCULAR | Status: AC
  Filled 2017-12-24: qty 5

## 2017-12-24 MED ORDER — HYDRALAZINE HCL 20 MG/ML IJ SOLN: 10.0000 mg | INTRAMUSCULAR | Status: DC | PRN

## 2017-12-24 MED ORDER — SIMETHICONE 80 MG PO CHEW: 80.0000 mg | CHEWABLE_TABLET | Freq: Four times a day (QID) | ORAL | Status: DC | PRN

## 2017-12-24 MED ORDER — LACTATED RINGERS IV SOLN
INTRAVENOUS | Status: DC | PRN
  Administered 2017-12-24: 08:00:00 via INTRAVENOUS

## 2017-12-24 MED ORDER — SUCCINYLCHOLINE CHLORIDE 200 MG/10ML IV SOSY
PREFILLED_SYRINGE | INTRAVENOUS | Status: AC
  Filled 2017-12-24: qty 10

## 2017-12-24 MED ORDER — FLUOXETINE HCL 20 MG PO CAPS
40.0000 mg | ORAL_CAPSULE | Freq: Every day | ORAL | Status: DC
  Administered 2017-12-25: 40 mg via ORAL
  Filled 2017-12-24: qty 2

## 2017-12-24 MED ORDER — ACETAMINOPHEN 500 MG PO TABS
1000.0000 mg | ORAL_TABLET | ORAL | Status: AC
  Administered 2017-12-24: 1000 mg via ORAL
  Filled 2017-12-24: qty 2

## 2017-12-24 MED ORDER — SCOPOLAMINE 1 MG/3DAYS TD PT72
1.0000 | MEDICATED_PATCH | TRANSDERMAL | Status: DC
  Administered 2017-12-24: 1.5 mg via TRANSDERMAL
  Filled 2017-12-24: qty 1

## 2017-12-24 MED ORDER — MORPHINE SULFATE (PF) 2 MG/ML IV SOLN
1.0000 mg | INTRAVENOUS | Status: DC | PRN
  Administered 2017-12-24: 1 mg via INTRAVENOUS
  Filled 2017-12-24: qty 1

## 2017-12-24 MED ORDER — FENTANYL CITRATE (PF) 100 MCG/2ML IJ SOLN
INTRAMUSCULAR | Status: AC
Start: 2017-12-24 — End: 2017-12-24
  Filled 2017-12-24: qty 2

## 2017-12-24 MED ORDER — SUGAMMADEX SODIUM 500 MG/5ML IV SOLN
INTRAVENOUS | Status: DC | PRN
  Administered 2017-12-24: 300 mg via INTRAVENOUS

## 2017-12-24 MED ORDER — DEXAMETHASONE SODIUM PHOSPHATE 10 MG/ML IJ SOLN
INTRAMUSCULAR | Status: DC | PRN
  Administered 2017-12-24: 10 mg via INTRAVENOUS

## 2017-12-24 MED ORDER — CHLORHEXIDINE GLUCONATE 4 % EX LIQD: 60.0000 mL | Freq: Once | CUTANEOUS | Status: DC

## 2017-12-24 MED ORDER — PREMIER PROTEIN SHAKE
2.0000 [oz_av] | ORAL | Status: DC
  Administered 2017-12-25 (×2): 2 [oz_av] via ORAL

## 2017-12-24 MED ORDER — FENTANYL CITRATE (PF) 100 MCG/2ML IJ SOLN
INTRAMUSCULAR | Status: DC | PRN
  Administered 2017-12-24: 100 ug via INTRAVENOUS
  Administered 2017-12-24 (×2): 50 ug via INTRAVENOUS

## 2017-12-24 MED ORDER — DEXAMETHASONE SODIUM PHOSPHATE 4 MG/ML IJ SOLN: 4.0000 mg | INTRAMUSCULAR | Status: DC

## 2017-12-24 MED ORDER — FENTANYL CITRATE (PF) 100 MCG/2ML IJ SOLN
25.0000 ug | INTRAMUSCULAR | Status: DC | PRN
  Administered 2017-12-24: 25 ug via INTRAVENOUS

## 2017-12-24 MED ORDER — ROCURONIUM BROMIDE 10 MG/ML (PF) SYRINGE
PREFILLED_SYRINGE | INTRAVENOUS | Status: DC | PRN
  Administered 2017-12-24: 60 mg via INTRAVENOUS

## 2017-12-24 MED ORDER — MIDAZOLAM HCL 2 MG/2ML IJ SOLN
INTRAMUSCULAR | Status: AC
  Filled 2017-12-24: qty 2

## 2017-12-24 MED ORDER — 0.9 % SODIUM CHLORIDE (POUR BTL) OPTIME
TOPICAL | Status: DC | PRN
  Administered 2017-12-24: 1000 mL

## 2017-12-24 MED ORDER — CEFOTETAN DISODIUM-DEXTROSE 2-2.08 GM-%(50ML) IV SOLR
2.0000 g | INTRAVENOUS | Status: AC
  Administered 2017-12-24: 2 g via INTRAVENOUS
  Filled 2017-12-24: qty 50

## 2017-12-24 MED ORDER — SUGAMMADEX SODIUM 500 MG/5ML IV SOLN
INTRAVENOUS | Status: AC
  Filled 2017-12-24: qty 5

## 2017-12-24 MED ORDER — ENOXAPARIN SODIUM 30 MG/0.3ML ~~LOC~~ SOLN
30.0000 mg | Freq: Two times a day (BID) | SUBCUTANEOUS | Status: DC
  Administered 2017-12-24 – 2017-12-25 (×2): 30 mg via SUBCUTANEOUS
  Filled 2017-12-24 (×2): qty 0.3

## 2017-12-24 MED ORDER — KETOROLAC TROMETHAMINE 30 MG/ML IJ SOLN
INTRAMUSCULAR | Status: AC
  Filled 2017-12-24: qty 1

## 2017-12-24 MED ORDER — ALPRAZOLAM 1 MG PO TABS
1.0000 mg | ORAL_TABLET | Freq: Two times a day (BID) | ORAL | Status: DC
  Administered 2017-12-24 – 2017-12-25 (×2): 1 mg via ORAL
  Filled 2017-12-24 (×2): qty 1

## 2017-12-24 MED ORDER — LIDOCAINE 20MG/ML (2%) 15 ML SYRINGE OPTIME
INTRAMUSCULAR | Status: DC | PRN
  Administered 2017-12-24: 1 mg/kg/h via INTRAVENOUS

## 2017-12-24 MED ORDER — BUPIVACAINE HCL (PF) 0.25 % IJ SOLN
INTRAMUSCULAR | Status: AC
  Filled 2017-12-24: qty 30

## 2017-12-24 MED ORDER — PANTOPRAZOLE SODIUM 40 MG IV SOLR
40.0000 mg | Freq: Every day | INTRAVENOUS | Status: DC
  Administered 2017-12-24: 40 mg via INTRAVENOUS
  Filled 2017-12-24: qty 40

## 2017-12-24 MED ORDER — OXYCODONE HCL 5 MG/5ML PO SOLN
5.0000 mg | ORAL | Status: DC | PRN
  Administered 2017-12-25: 5 mg via ORAL
  Filled 2017-12-24: qty 5

## 2017-12-24 MED ORDER — LACTATED RINGERS IR SOLN
Status: DC | PRN
  Administered 2017-12-24: 1000 mL

## 2017-12-24 MED ORDER — PROPOFOL 10 MG/ML IV BOLUS
INTRAVENOUS | Status: DC | PRN
  Administered 2017-12-24: 250 mg via INTRAVENOUS

## 2017-12-24 MED ORDER — ACETAMINOPHEN 160 MG/5ML PO SOLN
650.0000 mg | Freq: Four times a day (QID) | ORAL | Status: DC
  Administered 2017-12-24 – 2017-12-25 (×4): 650 mg via ORAL
  Filled 2017-12-24 (×4): qty 20.3

## 2017-12-24 MED ORDER — KETOROLAC TROMETHAMINE 30 MG/ML IJ SOLN
30.0000 mg | Freq: Once | INTRAMUSCULAR | Status: AC | PRN
  Administered 2017-12-24: 30 mg via INTRAVENOUS

## 2017-12-24 MED ORDER — ONDANSETRON HCL 4 MG/2ML IJ SOLN
INTRAMUSCULAR | Status: DC | PRN
  Administered 2017-12-24: 4 mg via INTRAVENOUS

## 2017-12-24 MED ORDER — ROCURONIUM BROMIDE 10 MG/ML (PF) SYRINGE
PREFILLED_SYRINGE | INTRAVENOUS | Status: AC
Start: 2017-12-24 — End: 2017-12-24
  Filled 2017-12-24: qty 5

## 2017-12-24 MED ORDER — MIDAZOLAM HCL 5 MG/5ML IJ SOLN
INTRAMUSCULAR | Status: DC | PRN
  Administered 2017-12-24: 2 mg via INTRAVENOUS

## 2017-12-24 MED ORDER — PROMETHAZINE HCL 25 MG/ML IJ SOLN
INTRAMUSCULAR | Status: AC
  Filled 2017-12-24: qty 1

## 2017-12-24 MED ORDER — HEPARIN SODIUM (PORCINE) 5000 UNIT/ML IJ SOLN
5000.0000 [IU] | INTRAMUSCULAR | Status: AC
  Administered 2017-12-24: 5000 [IU] via SUBCUTANEOUS
  Filled 2017-12-24: qty 1

## 2017-12-24 SURGICAL SUPPLY — 61 items
APL SKNCLS STERI-STRIP NONHPOA (GAUZE/BANDAGES/DRESSINGS) ×1
APPLIER CLIP 5 13 M/L LIGAMAX5 (MISCELLANEOUS)
APPLIER CLIP ROT 13.4 12 LRG (CLIP)
APR CLP LRG 13.4X12 ROT 20 MLT (CLIP)
APR CLP MED LRG 5 ANG JAW (MISCELLANEOUS)
BAG LAPAROSCOPIC 12 15 PORT 16 (BASKET) ×1 IMPLANT
BAG RETRIEVAL 12/15 (BASKET) ×2
BANDAGE ADH SHEER 1  50/CT (GAUZE/BANDAGES/DRESSINGS) ×12 IMPLANT
BENZOIN TINCTURE PRP APPL 2/3 (GAUZE/BANDAGES/DRESSINGS) ×2 IMPLANT
BLADE SURG SZ11 CARB STEEL (BLADE) ×2 IMPLANT
CABLE HIGH FREQUENCY MONO STRZ (ELECTRODE) ×2 IMPLANT
CHLORAPREP W/TINT 26ML (MISCELLANEOUS) ×3 IMPLANT
CLIP APPLIE 5 13 M/L LIGAMAX5 (MISCELLANEOUS) IMPLANT
CLIP APPLIE ROT 13.4 12 LRG (CLIP) IMPLANT
COVER SURGICAL LIGHT HANDLE (MISCELLANEOUS) ×2 IMPLANT
DRAIN CHANNEL 19F RND (DRAIN) IMPLANT
DRAPE UNIVERSAL PACK (DRAPES) ×2 IMPLANT
DRAPE UTILITY XL STRL (DRAPES) ×4 IMPLANT
ELECT REM PT RETURN 15FT ADLT (MISCELLANEOUS) ×2 IMPLANT
EVACUATOR SILICONE 100CC (DRAIN) IMPLANT
GAUZE SPONGE 4X4 12PLY STRL (GAUZE/BANDAGES/DRESSINGS) IMPLANT
GLOVE BIOGEL PI IND STRL 7.0 (GLOVE) ×1 IMPLANT
GLOVE BIOGEL PI INDICATOR 7.0 (GLOVE) ×1
GLOVE SURG SS PI 7.0 STRL IVOR (GLOVE) ×2 IMPLANT
GOWN STRL REUS W/TWL LRG LVL3 (GOWN DISPOSABLE) ×2 IMPLANT
GOWN STRL REUS W/TWL XL LVL3 (GOWN DISPOSABLE) ×6 IMPLANT
GRASPER SUT TROCAR 14GX15 (MISCELLANEOUS) ×2 IMPLANT
HANDLE STAPLE EGIA 4 XL (STAPLE) ×2 IMPLANT
HOVERMATT SINGLE USE (MISCELLANEOUS) ×2 IMPLANT
KIT BASIN OR (CUSTOM PROCEDURE TRAY) ×2 IMPLANT
MARKER SKIN DUAL TIP RULER LAB (MISCELLANEOUS) ×2 IMPLANT
NDL SPNL 22GX3.5 QUINCKE BK (NEEDLE) ×1 IMPLANT
NEEDLE SPNL 22GX3.5 QUINCKE BK (NEEDLE) ×2 IMPLANT
RELOAD EGIA 45 MED/THCK PURPLE (STAPLE) ×1 IMPLANT
RELOAD EGIA 60 MED/THCK PURPLE (STAPLE) ×6 IMPLANT
RELOAD EGIA BLACK ROTIC 45MM (STAPLE) IMPLANT
RELOAD STAPLE 45 BLK XTHK (STAPLE) IMPLANT
RELOAD STAPLE 60 BLK XTHK ART (STAPLE) ×1 IMPLANT
RELOAD STAPLE 60 MED/THCK ART (STAPLE) IMPLANT
RELOAD TRI 2.0 60 XTHK VAS SUL (STAPLE) ×2 IMPLANT
SCISSORS LAP 5X45 EPIX DISP (ENDOMECHANICALS) IMPLANT
SET IRRIG TUBING LAPAROSCOPIC (IRRIGATION / IRRIGATOR) ×2 IMPLANT
SHEARS HARMONIC ACE PLUS 45CM (MISCELLANEOUS) ×2 IMPLANT
SLEEVE GASTRECTOMY 40FR VISIGI (MISCELLANEOUS) ×2 IMPLANT
SLEEVE XCEL OPT CAN 5 100 (ENDOMECHANICALS) ×4 IMPLANT
SOLUTION ANTI FOG 6CC (MISCELLANEOUS) ×2 IMPLANT
SPONGE LAP 18X18 X RAY DECT (DISPOSABLE) ×2 IMPLANT
STRIP CLOSURE SKIN 1/2X4 (GAUZE/BANDAGES/DRESSINGS) ×2 IMPLANT
SUT ETHIBOND 0 36 GRN (SUTURE) IMPLANT
SUT ETHILON 2 0 PS N (SUTURE) IMPLANT
SUT MNCRL AB 4-0 PS2 18 (SUTURE) ×2 IMPLANT
SUT VICRYL 0 TIES 12 18 (SUTURE) ×2 IMPLANT
SYR 20CC LL (SYRINGE) ×2 IMPLANT
SYR 50ML LL SCALE MARK (SYRINGE) ×2 IMPLANT
TOWEL OR 17X26 10 PK STRL BLUE (TOWEL DISPOSABLE) ×2 IMPLANT
TOWEL OR NON WOVEN STRL DISP B (DISPOSABLE) ×2 IMPLANT
TROCAR BLADELESS 15MM (ENDOMECHANICALS) ×2 IMPLANT
TROCAR BLADELESS OPT 5 100 (ENDOMECHANICALS) ×2 IMPLANT
TUBING CONNECTING 10 (TUBING) ×3 IMPLANT
TUBING ENDO SMARTCAP PENTAX (MISCELLANEOUS) ×1 IMPLANT
TUBING INSUF HEATED (TUBING) ×2 IMPLANT

## 2017-12-24 NOTE — H&P (Signed)
Ana Walker is an 42 y.o. female.   Chief Complaint: obesity HPI: 42 yo female with long history of obesity and sleep apnea. She has completed all requirements and presents for sleeve gastrectomy.  Past Medical History:  Diagnosis Date  . Anxiety    on pristiq 50mg  daily  . Anxiety   . Morbid obesity with BMI of 50.0-59.9, adult (HCC) 10/22/2013  . Sleep apnea   . Sleep apnea    uses cpap    Past Surgical History:  Procedure Laterality Date  . COLONOSCOPY    . TUBAL LIGATION      No family history on file. Social History:  reports that she has never smoked. She has never used smokeless tobacco. She reports that she does not drink alcohol or use drugs.  Allergies: No Known Allergies  Medications Prior to Admission  Medication Sig Dispense Refill  . acetaminophen (TYLENOL) 325 MG tablet Take 650 mg by mouth daily as needed for moderate pain or headache.    . ALPRAZolam (XANAX) 1 MG tablet Take 1 mg by mouth 2 (two) times daily.    Marland Kitchen. FLUoxetine (PROZAC) 20 MG capsule Take 20 mg by mouth at bedtime.    Marland Kitchen. FLUoxetine (PROZAC) 40 MG capsule Take 40 mg by mouth daily.    . montelukast (SINGULAIR) 10 MG tablet Take 10 mg by mouth daily.    Marland Kitchen. acetaminophen (TYLENOL) 500 MG tablet Take 1,000 mg by mouth every 6 (six) hours as needed for mild pain or moderate pain.      No results found for this or any previous visit (from the past 48 hour(s)). No results found.  Review of Systems  Constitutional: Negative for chills and fever.  HENT: Negative for hearing loss.   Eyes: Negative for blurred vision and double vision.  Respiratory: Negative for cough and hemoptysis.   Cardiovascular: Negative for chest pain and palpitations.  Gastrointestinal: Negative for abdominal pain, nausea and vomiting.  Genitourinary: Negative for dysuria and urgency.  Musculoskeletal: Negative for myalgias and neck pain.  Skin: Negative for itching and rash.  Neurological: Negative for dizziness,  tingling and headaches.  Endo/Heme/Allergies: Does not bruise/bleed easily.  Psychiatric/Behavioral: Negative for depression and suicidal ideas.    Blood pressure 139/70, pulse 65, temperature 98.2 F (36.8 C), temperature source Oral, resp. rate 18, height 5\' 2"  (1.575 m), weight (!) 154.7 kg (341 lb), last menstrual period 11/24/2017, SpO2 96 %. Physical Exam  Vitals reviewed. Constitutional: She is oriented to person, place, and time. She appears well-developed and well-nourished.  HENT:  Head: Normocephalic and atraumatic.  Eyes: Pupils are equal, round, and reactive to light. Conjunctivae and EOM are normal.  Neck: Normal range of motion. Neck supple.  Cardiovascular: Normal rate and regular rhythm.  Respiratory: Effort normal and breath sounds normal.  GI: Soft. Bowel sounds are normal. She exhibits no distension. There is no tenderness.  Musculoskeletal: Normal range of motion.  Neurological: She is alert and oriented to person, place, and time.  Skin: Skin is warm and dry.  Psychiatric: She has a normal mood and affect. Her behavior is normal.     Assessment/Plan 42 yo female with obesity class III and sleep apnea -lap sleeve gastrectomy -bariatric and enhanced recovery protocol  Rodman PickleLuke Aaron Eliyah Bazzi, MD 12/24/2017, 8:29 AM

## 2017-12-24 NOTE — Anesthesia Procedure Notes (Signed)
Procedure Name: Intubation Date/Time: 12/24/2017 9:32 AM Performed by: Lind Covert, CRNA Pre-anesthesia Checklist: Patient identified, Emergency Drugs available, Patient being monitored, Timeout performed and Suction available Patient Re-evaluated:Patient Re-evaluated prior to induction Oxygen Delivery Method: Circle system utilized Preoxygenation: Pre-oxygenation with 100% oxygen Induction Type: IV induction Ventilation: Mask ventilation without difficulty Laryngoscope Size: Mac and 4 Grade View: Grade I Tube type: Oral Tube size: 7.0 mm Number of attempts: 1 Airway Equipment and Method: Stylet Placement Confirmation: ETT inserted through vocal cords under direct vision,  positive ETCO2 and breath sounds checked- equal and bilateral Secured at: 23 cm Tube secured with: Tape Dental Injury: Teeth and Oropharynx as per pre-operative assessment

## 2017-12-24 NOTE — Progress Notes (Signed)
Discussed post op day goals with patient including ambulation, IS, diet progression, pain, and nausea control.  Questions answered. 

## 2017-12-24 NOTE — Anesthesia Postprocedure Evaluation (Signed)
Anesthesia Post Note  Patient: Ovidio KinKimberly A Peugh  Procedure(s) Performed: LAPAROSCOPIC GASTRIC SLEEVE RESECTION WITH UPPER ENDO (N/A )     Patient location during evaluation: PACU Anesthesia Type: General Level of consciousness: awake and alert Pain management: pain level controlled Vital Signs Assessment: post-procedure vital signs reviewed and stable Respiratory status: spontaneous breathing, nonlabored ventilation, respiratory function stable and patient connected to nasal cannula oxygen Cardiovascular status: blood pressure returned to baseline and stable Postop Assessment: no apparent nausea or vomiting Anesthetic complications: no    Last Vitals:  Vitals:   12/24/17 1200 12/24/17 1220  BP: (!) 142/85 (!) 142/78  Pulse: 81 88  Resp: 19 18  Temp: 36.6 C 36.8 C  SpO2: 96% 96%    Last Pain:  Vitals:   12/24/17 1326  TempSrc:   PainSc: 6                  Trevor IhaStephen A Jamilett Ferrante

## 2017-12-24 NOTE — Transfer of Care (Signed)
Immediate Anesthesia Transfer of Care Note  Patient: Ana Walker  Procedure(s) Performed: LAPAROSCOPIC GASTRIC SLEEVE RESECTION WITH UPPER ENDO (N/A )  Patient Location: PACU  Anesthesia Type:General  Level of Consciousness: sedated  Airway & Oxygen Therapy: Patient Spontanous Breathing and Patient connected to face mask oxygen  Post-op Assessment: Report given to RN and Post -op Vital signs reviewed and stable  Post vital signs: Reviewed and stable  Last Vitals:  Vitals Value Taken Time  BP    Temp    Pulse 84 12/24/2017 10:53 AM  Resp 22 12/24/2017 10:53 AM  SpO2 100 % 12/24/2017 10:53 AM  Vitals shown include unvalidated device data.  Last Pain:  Vitals:   12/24/17 0821  TempSrc:   PainSc: 0-No pain      Patients Stated Pain Goal: 5 (12/24/17 [id number])  Complications: No apparent anesthesia complications

## 2017-12-24 NOTE — Progress Notes (Signed)
Pt started drinking first 30cc cup of water at 1718

## 2017-12-24 NOTE — Anesthesia Preprocedure Evaluation (Signed)
Anesthesia Evaluation  Patient identified by MRN, date of birth, ID band Patient awake    Reviewed: Allergy & Precautions, NPO status , Patient's Chart, lab work & pertinent test results  Airway Mallampati: III  TM Distance: <3 FB Neck ROM: Full    Dental no notable dental hx.    Pulmonary sleep apnea and Continuous Positive Airway Pressure Ventilation ,    Pulmonary exam normal breath sounds clear to auscultation       Cardiovascular negative cardio ROS Normal cardiovascular exam Rhythm:Regular Rate:Normal     Neuro/Psych Anxiety negative neurological ROS  negative psych ROS   GI/Hepatic negative GI ROS, Neg liver ROS,   Endo/Other  Morbid obesity  Renal/GU negative Renal ROS  negative genitourinary   Musculoskeletal negative musculoskeletal ROS (+)   Abdominal   Peds negative pediatric ROS (+)  Hematology negative hematology ROS (+)   Anesthesia Other Findings   Reproductive/Obstetrics negative OB ROS                             Anesthesia Physical Anesthesia Plan  ASA: III  Anesthesia Plan: General   Post-op Pain Management:    Induction: Intravenous  PONV Risk Score and Plan: 4 or greater and Scopolamine patch - Pre-op, Midazolam, Dexamethasone, Ondansetron and Treatment may vary due to age or medical condition  Airway Management Planned: Oral ETT  Additional Equipment:   Intra-op Plan:   Post-operative Plan: Extubation in OR  Informed Consent: I have reviewed the patients History and Physical, chart, labs and discussed the procedure including the risks, benefits and alternatives for the proposed anesthesia with the patient or authorized representative who has indicated his/her understanding and acceptance.   Dental advisory given  Plan Discussed with: CRNA and Surgeon  Anesthesia Plan Comments:         Anesthesia Quick Evaluation

## 2017-12-24 NOTE — Op Note (Signed)
Preoperative diagnosis: laparoscopic sleeve gastrectomy  Postoperative diagnosis: Same   Procedure: Upper endoscopy   Surgeon: Berna Buehelsea A Jazma Pickel, M.D.  Anesthesia: Gen.   Description of procedure: The endoscopy was placed in the mouth and into the oropharynx and under endoscopic vision it was advanced to the esophagogastric junction.  The pouch was insufflated and no bleeding or bubbles were seen.  The GEJ was identified at 40cm from the teeth. No undue angulation or narrowing of the lumen was present at the incisura. No bleeding or leaks were detected. The scope was withdrawn without difficulty.    Berna Buehelsea A Chantry Headen, M.D. General, Bariatric, & Minimally Invasive Surgery [facility]

## 2017-12-24 NOTE — Discharge Instructions (Signed)
° ° ° °GASTRIC BYPASS/SLEEVE ° Home Care Instructions ° ° These instructions are to help you care for yourself when you go home. ° °Call: If you have any problems. °• Call [phone] and ask for the surgeon on call °• If you need immediate help, come to the ER at [facility].  °• Tell the ER staff that you are a new post-op gastric bypass or gastric sleeve patient °  °Signs and symptoms to report: • Severe vomiting or nausea °o If you cannot keep down clear liquids for longer than 1 day, call your surgeon  °• Abdominal pain that does not get better after taking your pain medication °• Fever over 100.4° F with chills °• Heart beating over 100 beats a minute °• Shortness of breath at rest °• Chest pain °•  Redness, swelling, drainage, or foul odor at incision (surgical) sites °•  If your incisions open or pull apart °• Swelling or pain in calf (lower leg) °• Diarrhea (Loose bowel movements that happen often), frequent watery, uncontrolled bowel movements °• Constipation, (no bowel movements for 3 days) if this happens: Pick one °o Milk of Magnesia, 2 tablespoons by mouth, 3 times a day for 2 days if needed °o Stop taking Milk of Magnesia once you have a bowel movement °o Call your doctor if constipation continues °Or °o Miralax  (instead of Milk of Magnesia) following the label instructions °o Stop taking Miralax once you have a bowel movement °o Call your doctor if constipation continues °• Anything you think is not normal °  °Normal side effects after surgery: • Unable to sleep at night or unable to focus °• Irritability or moody °• Being tearful (crying) or depressed °These are common complaints, possibly related to your anesthesia medications that put you to sleep, stress of surgery, and change in lifestyle.  This usually goes away a few weeks after surgery.  If these feelings continue, call your primary care doctor. °  °Wound Care: You may have surgical glue, steri-strips, or staples over your incisions after  surgery °• Surgical glue:  Looks like a clear film over your incisions and will wear off a little at a time °• Steri-strips: Strips of tape over your incisions. You may notice a yellowish color on the skin under the steri-strips. This is used to make the   steri-strips stick better. Do not pull the steri-strips off - let them fall off °• Staples: Staples may be removed before you leave the hospital °o If you go home with staples, call [facility], [phone] at for an appointment with your surgeon’s nurse to have staples removed 10 days after surgery. °• Showering: You may shower two (2) days after your surgery unless your surgeon tells you differently °o Wash gently around incisions with warm soapy water, rinse well, and gently pat dry  °o No tub baths until staples are removed, steri-strips fall off or glue is gone.  °  °Medications: • Medications should be liquid or crushed if larger than the size of a dime °• Extended release pills (medication that release a little bit at a time through the day) should NOT be crushed or cut. (examples include XL, ER, DR, SR) °• Depending on the size and number of medications you take, you may need to space (take a few throughout the day)/change the time you take your medications so that you do not over-fill your pouch (smaller stomach) °• Make sure you follow-up with your primary care doctor to   make medication changes needed during rapid weight loss and life-style changes °• If you have diabetes, follow up with the doctor that orders your diabetes medication(s) within one week after surgery and check your blood sugar regularly. °• Do not drive while taking prescription pain medication  °• It is ok to take Tylenol by the bottle instructions with your pain medicine or instead of your pain medicine as needed.  DO NOT TAKE NSAIDS (EXAMPLES OF NSAIDS:  IBUPROFREN/ NAPROXEN)  °Diet:                    First 2 Weeks ° You will see the dietician t about two (2) weeks  after your surgery. The dietician will increase the types of foods you can eat if you are handling liquids well: °• If you have severe vomiting or nausea and cannot keep down clear liquids lasting longer than 1 day, call your surgeon @ ([phone]) °Protein Shake °• Drink at least 2 ounces of shake 5-6 times per day °• Each serving of protein shakes (usually 8 - 12 ounces) should have: °o 15 grams of protein  °o And no more than 5 grams of carbohydrate  °• Goal for protein each day: °o Men = 80 grams per day °o Women = 60 grams per day °• Protein powder may be added to fluids such as non-fat milk or Lactaid milk or unsweetened Soy/Almond milk (limit to 35 grams added protein powder per serving) ° °Hydration °• Slowly increase the amount of water and other clear liquids as tolerated (See Acceptable Fluids) °• Slowly increase the amount of protein shake as tolerated  °•  Sip fluids slowly and throughout the day.  Do not use straws. °• May use sugar substitutes in small amounts (no more than 6 - 8 packets per day; i.e. Splenda) ° °Fluid Goal °• The first goal is to drink at least 8 ounces of protein shake/drink per day (or as directed by the nutritionist); some examples of protein shakes are Syntrax Nectar, Adkins Advantage, EAS Edge HP, and Unjury. See handout from pre-op Bariatric Education Class: °o Slowly increase the amount of protein shake you drink as tolerated °o You may find it easier to slowly sip shakes throughout the day °o It is important to get your proteins in first °• Your fluid goal is to drink 64 - 100 ounces of fluid daily °o It may take a few weeks to build up to this °• 32 oz (or more) should be clear liquids  °And  °• 32 oz (or more) should be full liquids (see below for examples) °• Liquids should not contain sugar, caffeine, or carbonation ° °Clear Liquids: °• Water or Sugar-free flavored water (i.e. Fruit H2O, Propel) °• Decaffeinated coffee or tea (sugar-free) °• Crystal Lite, Wyler’s Lite,  Minute Maid Lite °• Sugar-free Jell-O °• Bouillon or broth °• Sugar-free Popsicle:   *Less than 20 calories each; Limit 1 per day ° °Full Liquids: °Protein Shakes/Drinks + 2 choices per day of other full liquids °• Full liquids must be: °o No More Than 15 grams of Carbs per serving  °o No More Than 3 grams of Fat per serving °• Strained low-fat cream soup (except Cream of Potato or Tomato) °• Non-Fat milk °• Fat-free Lactaid Milk °• Unsweetened Soy Or Unsweetened Almond Milk °• Low Sugar yogurt (Dannon Lite & Fit, Greek yogurt; Oikos Triple Zero; Chobani Simply 100; Yoplait 100 calorie Greek - No Fruit on the Bottom) ° °  °Vitamins   and Minerals • Start 1 day after surgery unless otherwise directed by your surgeon °• 2 Chewable Bariatric Specific Multivitamin / Multimineral Supplement with iron (Example: Bariatric Advantage Multi EA) °• Chewable Calcium with Vitamin D-3 °(Example: 3 Chewable Calcium Plus 600 with Vitamin D-3) °o Take 500 mg three (3) times a day for a total of 1500 mg each day °o Do not take all 3 doses of calcium at one time as it may cause constipation, and you can only absorb 500 mg  at a time  °o Do not mix multivitamins containing iron with calcium supplements; take 2 hours apart °• Menstruating women and those with a history of anemia (a blood disease that causes weakness) may need extra iron °o Talk with your doctor to see if you need more iron °• Do not stop taking or change any vitamins or minerals until you talk to your dietitian or surgeon °• Your Dietitian and/or surgeon must approve all vitamin and mineral supplements °  °Activity and Exercise: Limit your physical activity as instructed by your doctor.  It is important to continue walking at home.  During this time, use these guidelines: °• Do not lift anything greater than ten (10) pounds for at least two (2) weeks °• Do not go back to work or drive until your surgeon says you can °• You may have sex when you feel comfortable  °o It is  VERY important for female patients to use a reliable birth control method; fertility often increases after surgery  °o All hormonal birth control will be ineffective for 30 days after surgery due to medications given during surgery a barrier method must be used. °o Do not get pregnant for at least 18 months °• Start exercising as soon as your doctor tells you that you can °o Make sure your doctor approves any physical activity °• Start with a simple walking program °• Walk 5-15 minutes each day, 7 days per week.  °• Slowly increase until you are walking 30-45 minutes per day °Consider joining our BELT program. [phone] or email [email or web address] °  °Special Instructions Things to remember: °• Use your CPAP when sleeping if this applies to you ° °• [facility] has two free Bariatric Surgery Support Groups that meet monthly °o The 3rd Thursday of each month, 6 pm, [facility]  °o The 2nd Friday of each month, 11:45 am in the private dining room in the basement of [place] °• It is very important to keep all follow up appointments with your surgeon, dietitian, primary care physician, and behavioral health practitioner °• Routine follow up schedule with your surgeon include appointments at 2-3 weeks, 6-8 weeks, 6 months, and 1 year at a minimum.  Your surgeon may request to see you more often.   °o After the first year, please follow up with your bariatric surgeon and dietitian at least once a year in order to maintain best weight loss results °[facility]: [phone] °[facility]: [phone] °Bariatric Nurse Coordinator: [phone] °  °   Reviewed and Endorsed  °by [place] Patient Education Committee, June, 2016 °Edits Approved: Aug, 2018 ° ° ° °

## 2017-12-24 NOTE — Op Note (Signed)
Preop Diagnosis: Obesity Class III  Postop Diagnosis: same  Procedure performed: laparoscopic Sleeve Gastrectomy  Assitant: Ana Walker  Indications:  The patient is a 42 y.o. year-old morbidly obese female who has been followed in the Bariatric Clinic as an outpatient. This patient was diagnosed with morbid obesity with a BMI of Body mass index is 62.37 kg/m. and significant co-morbidities including sleep apnea.  The patient was counseled extensively in the Bariatric Outpatient Clinic and after a thorough explanation of the risks and benefits of surgery (including death from complications, bowel leak, infection such as peritonitis and/or sepsis, internal hernia, bleeding, need for blood transfusion, bowel obstruction, organ failure, pulmonary embolus, deep venous thrombosis, wound infection, incisional hernia, skin breakdown, and others entailed on the consent form) and after a compliant diet and exercise program, the patient was scheduled for an elective laparoscopic sleeve gastrectomy.  Description of Operation:  Following informed consent, the patient was taken to the operating room and placed on the operating table in the supine position.  She had previously received prophylactic antibiotics and subcutaneous heparin for DVT prophylaxis in the pre-op holding area.  After induction of general endotracheal anesthesia by the anesthesiologist, the patient underwent placement of sequential compression devices and an oro-gastric tube.  A timeout was confirmed by the surgery and anesthesia teams.  The patient was adequately padded at all pressure points and placed on a footboard to prevent slippage from the OR table during extremes of position during surgery.  She underwent a routine sterile prep and drape of her entire abdomen.    Next, A transverse incision was made under the left subcostal area and a 5mm optical viewing trocar was introduced into the peritoneal cavity. Pneumoperitoneum was applied  with a high flow and low pressure. A laparoscope was inserted to confirm placement. A extraperitoneal block was then placed at the lateral abdominal wall using exparel diluted with marcaine. 5 additional incisions were placed: 1 5mm trocar to the left of the midline. 1 additional 5mm trocar in the left lateral area, 1 12mm trocar in the right mid abdomen, 1 5mm trocar in the right subcostal area, and a Ana Walker retractor was placed through a subxiphoid incision.  The liver was moderately large but was successfully retracted. The hiatus appeared normal without hernia.  The fat pad at the GE junction was incised and the gastrodiaphragmatic ligament was divided using the Harmonic scalpel. Next, a hole was created through the lesser omentum along the greater curve of the stomach to enter the lesser sac. The vessels along the greater omentum were  Then ligated and divided using the Harmonic scalpel moving towards the spleen and then short gastric vessels were ligated and divided in the same fashion to fully mobilize the fundus. The left crus was identified to ensure completion of the dissection. Next the antrum was measured and dissection continued inferiorly along the greater curve towards the pylorus and stopped 6cm from the pylorus.   A 40Fr ViSiGi dilator was placed into the esophgaus and along the lesser curve of the stomach and placed on suction. 2 non-reinforced 60mm 4-41mm tristapler(s) followed by 4 60mm 3-4mm tristaplers were used to make the resection along the antrum being sure to stay well away from the angularis by angling the jaws of the stapler towards the greater curve and later completing the resection staying along the ViSiGi and ensuring the fundus was not retained by appropriately retracting it lateral. Air was inserted through the ViSiGi to perform a leak test showing no  bubbles and a neutral lie of the stomach.  The assistant then went and performed an upper endoscopy and leak test. No  bubbles were seen and the sleeve and antrum distended appropriately. The specimen was then placed in an endocatch bag and removed by the 15mm port. The fascia of the 15mm port was closed with a 0 vicryl by suture passer. Hemostasis was ensured. Pneumoperitoneum was evacuated, all ports were removed and all incisions closed with 4-0 monocryl suture in subcuticular fashion. Steristrips and bandaids were put in place for dressing. The patient awoke from anesthesia and was brought to pacu in stable condition. All counts were correct.  Estimated blood loss: <1730ml  Specimens:  Sleeve gastrectomy  Local Anesthesia: 50 ml Exparel:0.5% Marcaine mix  Post-Op Plan:       Pain Management: PO, prn      Antibiotics: Prophylactic      Anticoagulation: Prophylactic, Starting now      Post Op Studies/Consults: Not applicable      Intended Discharge: within 48h      Intended Outpatient Follow-Up: Two Week      Intended Outpatient Studies: Not Applicable      Other: Not Applicable   De BlanchLuke Walker Ana Walker

## 2017-12-25 ENCOUNTER — Encounter (HOSPITAL_COMMUNITY): Payer: Self-pay | Admitting: General Surgery

## 2017-12-25 LAB — CBC WITH DIFFERENTIAL/PLATELET
BASOS PCT: 0 %
Basophils Absolute: 0 10*3/uL (ref 0.0–0.1)
EOS ABS: 0 10*3/uL (ref 0.0–0.7)
Eosinophils Relative: 0 %
HCT: 33.4 % — ABNORMAL LOW (ref 36.0–46.0)
Hemoglobin: 10.7 g/dL — ABNORMAL LOW (ref 12.0–15.0)
LYMPHS ABS: 1.3 10*3/uL (ref 0.7–4.0)
Lymphocytes Relative: 10 %
MCH: 29.3 pg (ref 26.0–34.0)
MCHC: 32 g/dL (ref 30.0–36.0)
MCV: 91.5 fL (ref 78.0–100.0)
MONO ABS: 1.1 10*3/uL — AB (ref 0.1–1.0)
Monocytes Relative: 8 %
NEUTROS PCT: 82 %
Neutro Abs: 10.9 10*3/uL — ABNORMAL HIGH (ref 1.7–7.7)
PLATELETS: 266 10*3/uL (ref 150–400)
RBC: 3.65 MIL/uL — ABNORMAL LOW (ref 3.87–5.11)
RDW: 14.2 % (ref 11.5–15.5)
WBC: 13.3 10*3/uL — ABNORMAL HIGH (ref 4.0–10.5)

## 2017-12-25 LAB — COMPREHENSIVE METABOLIC PANEL
ALT: 94 U/L — ABNORMAL HIGH (ref 14–54)
ANION GAP: 9 (ref 5–15)
AST: 57 U/L — ABNORMAL HIGH (ref 15–41)
Albumin: 3 g/dL — ABNORMAL LOW (ref 3.5–5.0)
Alkaline Phosphatase: 70 U/L (ref 38–126)
BUN: 10 mg/dL (ref 6–20)
CO2: 24 mmol/L (ref 22–32)
Calcium: 8.4 mg/dL — ABNORMAL LOW (ref 8.9–10.3)
Chloride: 105 mmol/L (ref 101–111)
Creatinine, Ser: 0.69 mg/dL (ref 0.44–1.00)
Glucose, Bld: 139 mg/dL — ABNORMAL HIGH (ref 65–99)
POTASSIUM: 4.1 mmol/L (ref 3.5–5.1)
Sodium: 138 mmol/L (ref 135–145)
Total Bilirubin: 1.1 mg/dL (ref 0.3–1.2)
Total Protein: 6.6 g/dL (ref 6.5–8.1)

## 2017-12-25 NOTE — Progress Notes (Signed)
Patient alert and oriented, pain is controlled. Patient is tolerating fluids, advanced to protein shake today, patient is tolerating well.  Reviewed Gastric sleeve discharge instructions with patient and patient is able to articulate understanding.  Provided information on BELT program, Support Group and [facility] outpatient pharmacy. All questions answered, will continue to monitor.  

## 2017-12-25 NOTE — Discharge Summary (Signed)
Physician Discharge Summary  Ana Walker:[id number] DOB: [birth year] DOA: 12/24/2017  PCP: Ana Walker (Inactive)  Admit date: 12/24/2017 Discharge date: 12/25/2017  Recommendations for Outpatient Follow-up:  1.  (include homehealth, outpatient follow-up instructions, specific recommendations for PCP to follow-up on, etc.)  Follow-up Information    Kinsinger, De BlanchLuke Aaron, MD. Go on 01/09/2018.   Specialty:  General Surgery Why:  at 415.  Please arrive 15 minutes early for your appointment.  Thank you. Contact information: [address] [place] [address] [phone]        Kinsinger, De BlanchLuke Aaron, MD .   Specialty:  General Surgery Contact information: [address] [place] [address] [phone]          Discharge Diagnoses:  Active Problems:   Morbid obesity [facility](HCC)   Surgical Procedure: Laparoscopic Sleeve Gastrectomy, upper endoscopy  Discharge Condition: Good Disposition: Home  Diet recommendation: Postoperative sleeve gastrectomy diet (liquids only)  Filed Weights   12/24/17 0810 12/24/17 0821  Weight: (!) 157.9 kg (348 lb) (!) 154.7 kg (341 lb)     Hospital Course:  The patient was admitted after undergoing laparoscopic sleeve gastrectomy. POD 0 she ambulated well. POD 1 she was started on the water diet protocol and tolerated 700 ml in the first shift. Once meeting the water amount she was advanced to bariatric protein shakes which they tolerated and were discharged home POD 1.  Treatments: surgery: laparoscopic sleeve gastrectomy  Discharge Instructions  Discharge Instructions    Ambulate hourly while awake   Complete by:  As directed    Call MD for:  difficulty breathing, headache or visual disturbances   Complete by:  As directed    Call MD for:  persistant dizziness or light-headedness   Complete by:  As directed    Call MD for:  persistant nausea and vomiting   Complete by:  As directed    Call MD for:   redness, tenderness, or signs of infection (pain, swelling, redness, odor or green/yellow discharge around incision site)   Complete by:  As directed    Call MD for:  severe uncontrolled pain   Complete by:  As directed    Call MD for:  temperature >101 F   Complete by:  As directed    Diet bariatric full liquid   Complete by:  As directed    Discharge wound care:   Complete by:  As directed    Remove Bandaids tomorrow, ok to shower tomorrow. Steristrips may fall off in 1-3 weeks.   Incentive spirometry   Complete by:  As directed    Perform hourly while awake     Allergies as of 12/25/2017   No Known Allergies     Medication List    STOP taking these medications   acetaminophen 325 MG tablet Commonly known as:  TYLENOL   acetaminophen 500 MG tablet Commonly known as:  TYLENOL     TAKE these medications   ALPRAZolam 1 MG tablet Commonly known as:  XANAX Take 1 mg by mouth 2 (two) times daily.   FLUoxetine 20 MG capsule Commonly known as:  PROZAC Take 20 mg by mouth at bedtime.   FLUoxetine 40 MG capsule Commonly known as:  PROZAC Take 40 mg by mouth daily.   montelukast 10 MG tablet Commonly known as:  SINGULAIR Take 10 mg by mouth daily.            Discharge Care Instructions  (From  admission, onward)        Start     Ordered   12/25/17 0000  Discharge wound care:    Comments:  Remove Bandaids tomorrow, ok to shower tomorrow. Steristrips may fall off in 1-3 weeks.   12/25/17 0740     Follow-up Information    Kinsinger, De Blanch, MD. Go on 01/09/2018.   Specialty:  General Surgery Why:  at 415.  Please arrive 15 minutes early for your appointment.  Thank you. Contact information: [address] [place] [address] [phone]        Kinsinger, De Blanch, MD .   Specialty:  General Surgery Contact information: [address] [place] [address] [phone]            The results of significant diagnostics  from this hospitalization (including imaging, microbiology, ancillary and laboratory) are listed below for reference.    Significant Diagnostic Studies: No results found.  Labs: Basic Metabolic Panel: Recent Labs  Lab 12/19/17 0827 12/25/17 0503  NA 138 138  K 3.9 4.1  CL 102 105  CO2 26 24  GLUCOSE 141* 139*  BUN 14 10  CREATININE 0.69 0.69  CALCIUM 9.1 8.4*   Liver Function Tests: Recent Labs  Lab 12/25/17 0503  AST 57*  ALT 94*  ALKPHOS 70  BILITOT 1.1  PROT 6.6  ALBUMIN 3.0*    CBC: Recent Labs  Lab 12/19/17 0827 12/24/17 1110 12/25/17 0503  WBC 9.2  --  13.3*  NEUTROABS  --   --  10.9*  HGB 11.7* 12.0 10.7*  HCT 37.8 37.6 33.4*  MCV 94.5  --  91.5  PLT 237  --  266    CBG: No results for input(s): GLUCAP in the last 168 hours.  Active Problems:   Morbid obesity (HCC)   Time coordinating discharge: 

## 2017-12-25 NOTE — Progress Notes (Signed)
Pt alert and oriented.  Tolerating diet.  D/C instructions were given and all questions were answered. Pt was d/cd home with daughter.

## 2017-12-25 NOTE — Progress Notes (Signed)
Patient alert and oriented, Post op day 1.  Provided support and encouragement.  Encouraged pulmonary toilet, ambulation and small sips of liquids.  Completed 120 ounces of bari clear fluids and 1/2 shake. All questions answered.  Will continue to monitor.

## 2017-12-31 ENCOUNTER — Telehealth (HOSPITAL_COMMUNITY): Payer: Self-pay

## 2017-12-31 NOTE — Telephone Encounter (Signed)
Patient called to discuss post bariatric surgery follow up questions.  See below:   1.  Tell me about your pain and pain management?has not had to take any pain medication since discharge  2.  Let's talk about fluid intake.  How much total fluid are you taking in?Has consistently been getting 64 ounces of fluid each day  3.  How much protein have you taken in the last 2 days?60 grams of protein each day  4.  Have you had nausea?  Tell me about when have experienced nausea and what you did to help?has not had nausea or needed Zofran for nausea  5.  Has the frequency or color changed with your urine?urinating frequently light in color  6.  Tell me what your incisions look like?steri strips are starting to come off, had "water blister" by on of the strips but no problem  7.  Have you been passing gas? BM?had loose bowel movements for several days that was corrected when starting out with different fluids other than protein shake  8.  If a problem or question were to arise who would you call?  Do you know contact numbers for [facility], [facility], and [facility]?aware of how to contact all the services  9.  How has the walking going?ambulating every hour, Drove to [employer] yesterday and walked around store  10.  How are your vitamins and calcium going?  How are you taking them?Taking bariatric advantage and Viactive calcium chews as discussed at discharge. No questions or problem with either product

## 2018-01-01 ENCOUNTER — Ambulatory Visit: Payer: Self-pay

## 2018-01-04 DIAGNOSIS — G4733 Obstructive sleep apnea (adult) (pediatric): Secondary | ICD-10-CM | POA: Diagnosis not present

## 2018-01-07 DIAGNOSIS — F4323 Adjustment disorder with mixed anxiety and depressed mood: Secondary | ICD-10-CM | POA: Diagnosis not present

## 2018-01-08 ENCOUNTER — Encounter: Payer: BLUE CROSS/BLUE SHIELD | Attending: General Surgery | Admitting: Registered"

## 2018-01-08 DIAGNOSIS — Z6841 Body Mass Index (BMI) 40.0 and over, adult: Secondary | ICD-10-CM | POA: Insufficient documentation

## 2018-01-08 DIAGNOSIS — G4733 Obstructive sleep apnea (adult) (pediatric): Secondary | ICD-10-CM | POA: Diagnosis not present

## 2018-01-08 DIAGNOSIS — E669 Obesity, unspecified: Secondary | ICD-10-CM

## 2018-01-08 NOTE — Progress Notes (Signed)
Bariatric Class:  Appt start time: 1530 end time:  1630.  2 Week Post-Operative Nutrition Class  Patient was seen on 01/08/2018 for Post-Operative Nutrition [facility].   Surgery date: 12/24/2017 Surgery type: Sleeve  Start weight at [facility]: 348 Weight today: 324.8 Weight change: 23.2  Pt states she is drinking at least 64 ounces of fluid a day and consuming at least 60 grams of protein a day. No issues or concerns reported.   TANITA  BODY COMP RESULTS  01/08/2018   BMI (kg/m^2) 59.4   Fat Mass (lbs) 186.4   Fat Free Mass (lbs) 138.4   Total Body Water (lbs) 104.4   The following the learning objectives were met by the patient during this course:  Identifies Phase 3A (Soft, High Proteins) Dietary Goals and will begin from 2 weeks post-operatively to 2 months post-operatively  Identifies appropriate sources of fluids and proteins   States protein recommendations and appropriate sources post-operatively  Identifies the need for appropriate texture modifications, mastication, and bite sizes when consuming solids  Identifies appropriate multivitamin and calcium sources post-operatively  Describes the need for physical activity post-operatively and will follow MD recommendations  States when to call healthcare provider regarding medication questions or post-operative complications  Handouts given during class include:  Phase 3A: Soft, High Protein Diet Handout  Follow-Up Plan: [facility] for 2 month post-op nutrition visit for diet advancement per MD.

## 2018-01-16 ENCOUNTER — Telehealth: Payer: Self-pay | Admitting: Registered"

## 2018-01-16 NOTE — Telephone Encounter (Signed)
RD called pt to verify fluid intake once restarting soft, solid proteins 2 week post-bariatric surgery.   Daily Fluid intake: 45-64 ounces Daily Protein intake: 60+ grams  Concerns/issues: none stated

## 2018-01-31 DIAGNOSIS — F4323 Adjustment disorder with mixed anxiety and depressed mood: Secondary | ICD-10-CM | POA: Diagnosis not present

## 2018-02-07 DIAGNOSIS — F4323 Adjustment disorder with mixed anxiety and depressed mood: Secondary | ICD-10-CM | POA: Diagnosis not present

## 2018-02-14 DIAGNOSIS — F4323 Adjustment disorder with mixed anxiety and depressed mood: Secondary | ICD-10-CM | POA: Diagnosis not present

## 2018-02-14 DIAGNOSIS — K912 Postsurgical malabsorption, not elsewhere classified: Secondary | ICD-10-CM | POA: Diagnosis not present

## 2018-02-14 DIAGNOSIS — Z01818 Encounter for other preprocedural examination: Secondary | ICD-10-CM | POA: Diagnosis not present

## 2018-02-14 DIAGNOSIS — R69 Illness, unspecified: Secondary | ICD-10-CM | POA: Diagnosis not present

## 2018-02-14 DIAGNOSIS — Z9884 Bariatric surgery status: Secondary | ICD-10-CM | POA: Diagnosis not present

## 2018-02-14 DIAGNOSIS — E669 Obesity, unspecified: Secondary | ICD-10-CM | POA: Diagnosis not present

## 2018-02-19 ENCOUNTER — Encounter: Payer: BLUE CROSS/BLUE SHIELD | Attending: General Surgery | Admitting: Skilled Nursing Facility1

## 2018-02-19 ENCOUNTER — Encounter: Payer: Self-pay | Admitting: Skilled Nursing Facility1

## 2018-02-19 DIAGNOSIS — G4733 Obstructive sleep apnea (adult) (pediatric): Secondary | ICD-10-CM | POA: Insufficient documentation

## 2018-02-19 DIAGNOSIS — Z6841 Body Mass Index (BMI) 40.0 and over, adult: Secondary | ICD-10-CM | POA: Diagnosis not present

## 2018-02-19 NOTE — Progress Notes (Signed)
Follow-up visit:  8 Weeks Post-Operative sleeve Surgery  Primary concerns today: Post-operative Bariatric Surgery Nutrition Management.  Pt states she carries a cooler with snacks and drinks around with her. Pt states she does eat every 3-5 hours. Pt states her energy level is good. Pt states she has the [employer]. Pt asked if it would be terrible if she did not eat carbohydrates ever again and states she is scared to add in starchy vegetables. Dietitian advised she speak with her therapist about her food fears and to focus on the eating behaviors and the importance of not eating as a coping mechanism.   Surgery date: 12/24/2017 Surgery type: Sleeve  Start weight at [facility]: 348 Weight today: 307.8 Weight change: 17  [name]  BODY COMP RESULTS  01/08/2018 02/19/2018   BMI (kg/m^2) 59.4 54.5   Fat Mass (lbs) 186.4 163.4   Fat Free Mass (lbs) 138.4 144.4   Total Body Water (lbs) 104.4 107.8   24-hr recall: B (AM): 2 eggs  Snk (AM): deli meat and cheese L (PM): protein shake Snk (PM):  D (PM): cheeseburger or lima beans  Snk (PM):   Fluid intake: natures twist, flavor adds to water, decaf unsweet tea with splenda, water: 45+ Estimated total protein intake: 60+  Medications: see list Supplementation: will buy capsules because chewable is gross  Using straws: no Drinking while eating: no Having you been chewing well: yes Chewing/swallowing difficulties: no Changes in vision: no Changes to mood/headaches: no Hair loss/Cahnges to skin/Changes to nails: no Any difficulty focusing or concentrating: no Sweating: no Dizziness/Lightheaded: no Palpitations: no  Carbonated beverages: no N/V/D/C/GAS: no Abdominal Pain: no Dumping syndrome: no  Recent physical activity:  ADl's  Progress Towards Goal(s):  In progress.  Handouts given during visit include:  nonstarchy vegetables + protein    Nutritional Diagnosis:  [place]-3.3 Overweight/obesity related to past poor dietary habits  and physical inactivity as evidenced by patient w/ recent sleeve surgery following dietary guidelines for continued weight loss.  Intervention:  Nutrition counseling. Dietitian educated the pt on advancing her diet to include non-starchy vegetables  Goals: -Add in non starchy vegetables -Work on your relationship with food and not useing as a coping mechanism   Teaching Method Utilized:  Visual Auditory Hands on  Barriers to learning/adherence to lifestyle change: relationship with food  Demonstrated degree of understanding via:  Teach Back   Monitoring/Evaluation:  Dietary intake, exercise, and body weight.

## 2018-02-21 DIAGNOSIS — F4323 Adjustment disorder with mixed anxiety and depressed mood: Secondary | ICD-10-CM | POA: Diagnosis not present

## 2018-02-22 DIAGNOSIS — E669 Obesity, unspecified: Secondary | ICD-10-CM | POA: Diagnosis not present

## 2018-02-22 DIAGNOSIS — Z6841 Body Mass Index (BMI) 40.0 and over, adult: Secondary | ICD-10-CM | POA: Diagnosis not present

## 2018-02-22 DIAGNOSIS — F419 Anxiety disorder, unspecified: Secondary | ICD-10-CM | POA: Diagnosis not present

## 2018-02-26 DIAGNOSIS — F4323 Adjustment disorder with mixed anxiety and depressed mood: Secondary | ICD-10-CM | POA: Diagnosis not present

## 2018-02-27 ENCOUNTER — Telehealth: Payer: Self-pay | Admitting: Skilled Nursing Facility1

## 2018-02-27 NOTE — Telephone Encounter (Signed)
Pt asked if she can eat lettuce, kalata olives, and peanut butter.  Dietitian replied yes.

## 2018-02-28 DIAGNOSIS — G4733 Obstructive sleep apnea (adult) (pediatric): Secondary | ICD-10-CM | POA: Diagnosis not present

## 2018-03-13 DIAGNOSIS — F4323 Adjustment disorder with mixed anxiety and depressed mood: Secondary | ICD-10-CM | POA: Diagnosis not present

## 2018-03-18 DIAGNOSIS — F411 Generalized anxiety disorder: Secondary | ICD-10-CM | POA: Diagnosis not present

## 2018-03-26 DIAGNOSIS — F411 Generalized anxiety disorder: Secondary | ICD-10-CM | POA: Diagnosis not present

## 2018-04-02 DIAGNOSIS — F411 Generalized anxiety disorder: Secondary | ICD-10-CM | POA: Diagnosis not present

## 2018-04-09 DIAGNOSIS — F411 Generalized anxiety disorder: Secondary | ICD-10-CM | POA: Diagnosis not present

## 2018-04-16 DIAGNOSIS — F411 Generalized anxiety disorder: Secondary | ICD-10-CM | POA: Diagnosis not present

## 2018-04-23 DIAGNOSIS — F411 Generalized anxiety disorder: Secondary | ICD-10-CM | POA: Diagnosis not present

## 2018-04-30 DIAGNOSIS — F411 Generalized anxiety disorder: Secondary | ICD-10-CM | POA: Diagnosis not present

## 2018-05-07 DIAGNOSIS — F411 Generalized anxiety disorder: Secondary | ICD-10-CM | POA: Diagnosis not present

## 2018-05-14 DIAGNOSIS — F411 Generalized anxiety disorder: Secondary | ICD-10-CM | POA: Diagnosis not present

## 2018-05-20 DIAGNOSIS — F411 Generalized anxiety disorder: Secondary | ICD-10-CM | POA: Diagnosis not present

## 2018-06-03 DIAGNOSIS — F411 Generalized anxiety disorder: Secondary | ICD-10-CM | POA: Diagnosis not present

## 2018-06-04 ENCOUNTER — Encounter: Payer: BLUE CROSS/BLUE SHIELD | Attending: General Surgery | Admitting: Registered"

## 2018-06-04 DIAGNOSIS — E669 Obesity, unspecified: Secondary | ICD-10-CM

## 2018-06-04 DIAGNOSIS — Z713 Dietary counseling and surveillance: Secondary | ICD-10-CM | POA: Insufficient documentation

## 2018-06-04 DIAGNOSIS — G4733 Obstructive sleep apnea (adult) (pediatric): Secondary | ICD-10-CM | POA: Diagnosis not present

## 2018-06-05 NOTE — Progress Notes (Signed)
Follow-up visit:  Post-Operative Sleeve Gastrectomy Surgery  Medical Nutrition Therapy:  Appt start time: 6:00pm end time:  7:00pm  Primary concerns today: Post-operative Bariatric Surgery Nutrition Management 6 Month Post-Op Class  Surgery date: 12/24/2017 Surgery type: Sleeve  Start weight at [facility]: 348 Weight today: 272.6 Weight change: 35.2 lbs loss from 307.8 (02/19/2018)  TANITA  BODY COMP RESULTS  01/08/2018 02/19/2018 06/04/2018   BMI (kg/m^2) 59.4 54.5 48.3   Fat Mass (lbs) 186.4 163.4 138.4   Fat Free Mass (lbs) 138.4 144.4 134.2   Total Body Water (lbs) 104.4 107.8 99.4     Information Reviewed/ Discussed During Appointment: -Review of composition scale numbers -Fluid requirements (64-100 ounces) -Protein requirements (60-80g) -Strategies for tolerating diet -Advancement of diet to include Starchy vegetables -Barriers to inclusion of new foods -Inclusion of appropriate multivitamin and calcium supplements  -Exercise recommendations   Fluid intake: 50-64 oz  Medications: See list Supplementation: Bariatric Advantage capsule with iron + 3 calcium supplements  Using straws:  Drinking while eating:  Having you been chewing well:  Chewing/swallowing difficulties: Changes in vision: no Changes to mood/headaches: no Hair loss/Changes to skin/Changes to nails: no, no, no Any difficulty focusing or concentrating: no Sweating: no Dizziness/Lightheaded: no Palpitations: no  Carbonated beverages: no N/V/D/C/GAS: no, no, no, no, no Abdominal Pain: no Dumping syndrome: no  Progress Towards Goal(s):  In progress.  Handouts given during visit include:  Phase V diet Progression   Goals Sheet  BELT program  Pt Chosen Goals:  I will drink 64 ounces of any sugar free, carbonation free option 7 days a week by 08/10/2018.   I will chew my food until completely applesauce texture every time I eat by 08/10/2018.    Teaching Method Utilized:   Visual Auditory Hands on   Demonstrated degree of understanding via:  Teach Back   Monitoring/Evaluation:  Dietary intake, exercise, and body weight. Follow up in 3 months for 9 month post-op visit.

## 2018-06-12 ENCOUNTER — Telehealth: Payer: Self-pay | Admitting: Skilled Nursing Facility1

## 2018-06-12 DIAGNOSIS — Z6841 Body Mass Index (BMI) 40.0 and over, adult: Secondary | ICD-10-CM | POA: Diagnosis not present

## 2018-06-12 DIAGNOSIS — J309 Allergic rhinitis, unspecified: Secondary | ICD-10-CM | POA: Diagnosis not present

## 2018-06-12 DIAGNOSIS — F419 Anxiety disorder, unspecified: Secondary | ICD-10-CM | POA: Diagnosis not present

## 2018-06-12 NOTE — Telephone Encounter (Signed)
Opened in error

## 2018-06-17 DIAGNOSIS — F411 Generalized anxiety disorder: Secondary | ICD-10-CM | POA: Diagnosis not present

## 2018-06-26 DIAGNOSIS — M546 Pain in thoracic spine: Secondary | ICD-10-CM | POA: Diagnosis not present

## 2018-06-26 DIAGNOSIS — S134XXA Sprain of ligaments of cervical spine, initial encounter: Secondary | ICD-10-CM | POA: Diagnosis not present

## 2018-06-26 DIAGNOSIS — S338XXA Sprain of other parts of lumbar spine and pelvis, initial encounter: Secondary | ICD-10-CM | POA: Diagnosis not present

## 2018-07-08 ENCOUNTER — Ambulatory Visit (INDEPENDENT_AMBULATORY_CARE_PROVIDER_SITE_OTHER): Payer: BLUE CROSS/BLUE SHIELD | Admitting: Psychiatry

## 2018-07-08 DIAGNOSIS — F411 Generalized anxiety disorder: Secondary | ICD-10-CM | POA: Diagnosis not present

## 2018-07-08 NOTE — Progress Notes (Signed)
      [facility] Counselor/Therapist Progress Note   Patient ID: Ana Walker, MRN: [id number]  Date: 07/08/2018  Timespent: 60 minutes  Treatment Type: Individual  Subjective: Patient in today reporting anxiety, sadness, and very stressed.  Having difficulty getting out of the house and being in public, oversleeping to avoid feelings, feeling drained emotionally.  Has just undergone another job transition that was her choice.  The situation at prior job had traumatic ending that patient is still processing.  She has however gotten another job already.  States she is doing ok so far at new job as it's totally training so far.  But concerned going forward.  During session, we processed her to recent job endings.  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  Had a couple bad interactions and quit her jobs.  Talked through frustrations, hurt, disappointment, and anger.  Worked on some strategies to help her better manage her anxiety, stress, and sadness.  Also encouraged her to let go of things she's been holding onto and taking responsibility for things that happened that are not her responsibility.  Patient to practice these strategies along with improved self-care (exercise, better sleep habits, eat healthy, limit setting, and positive self-talk).  To also use journaling as a tool for further processing with the goal of being able to "let go" in order to move forward. Patient was calmer by end of session and appeared invested in following through on homework. Denies any suicidal/homicidal thoughts/plans.  Has some social and church plans over the next couple weeks that will be good for her especially at this time.  Will see again in approx 2 weeks.    Interventions:Solution Focused, Strength-based and Supportive  Mental Status Exam:   Appearance:   Neat     Behavior:   Appropriate and Sharing  Motor:  Normal  Speech/Language:   Normal Rate  Affect:  Congruent  Mood:  anxious and sad  Thought process:  Coherent  Thought content:    Logical  Perceptual disturbances:    Normal  Orientation:  Full (Time, Place, and Person)  Attention:  Good  Concentration:  good  Memory:  Immediate  Fund of knowledge:   Good  Insight:    Good  Judgment:   Good  Impulse Control:  good    Reported Symptoms: Anxiety, oversleeping to escape feelings, avoidance desire to not get out in public,   Risk Assessment: Danger to Self:  No Self-injurious Behavior: No Danger to Others: No Duty to Warn:no Physical Aggression / Violence:No  Access to Firearms a concern: No  Gang Involvement:No   Diagnosis:   ICD-10-CM   1. Generalized anxiety disorder F41.1      Plan: Plan to see patient again in approx. 2 weeks to continue goal-directed treatment.  Mathis Fare, LCSW

## 2018-07-09 DIAGNOSIS — S338XXA Sprain of other parts of lumbar spine and pelvis, initial encounter: Secondary | ICD-10-CM | POA: Diagnosis not present

## 2018-07-09 DIAGNOSIS — G4733 Obstructive sleep apnea (adult) (pediatric): Secondary | ICD-10-CM | POA: Diagnosis not present

## 2018-07-09 DIAGNOSIS — S134XXA Sprain of ligaments of cervical spine, initial encounter: Secondary | ICD-10-CM | POA: Diagnosis not present

## 2018-07-09 DIAGNOSIS — M546 Pain in thoracic spine: Secondary | ICD-10-CM | POA: Diagnosis not present

## 2018-07-23 DIAGNOSIS — M546 Pain in thoracic spine: Secondary | ICD-10-CM | POA: Diagnosis not present

## 2018-07-23 DIAGNOSIS — S338XXA Sprain of other parts of lumbar spine and pelvis, initial encounter: Secondary | ICD-10-CM | POA: Diagnosis not present

## 2018-07-23 DIAGNOSIS — S134XXA Sprain of ligaments of cervical spine, initial encounter: Secondary | ICD-10-CM | POA: Diagnosis not present

## 2018-07-30 ENCOUNTER — Ambulatory Visit (INDEPENDENT_AMBULATORY_CARE_PROVIDER_SITE_OTHER): Payer: BLUE CROSS/BLUE SHIELD | Admitting: Psychiatry

## 2018-07-30 DIAGNOSIS — F411 Generalized anxiety disorder: Secondary | ICD-10-CM | POA: Diagnosis not present

## 2018-07-30 NOTE — Progress Notes (Signed)
      [facility] Counselor/Therapist Progress Note   Patient ID: Ana Walker, MRN: [id number]  Date: 07/30/2018  Timespent:  58 minutes   Treatment Type: Individual   Reported Symptoms: anxiety, some depression   Mental Status Exam:    Appearance:   Casual     Behavior:  Appropriate and Sharing  Motor:  Normal  Speech/Language:   Normal Rate  Affect:  Blunt  Mood:  anxious and depressed  Thought process:  normal  Thought content:    WNL  Sensory/Perceptual disturbances:    WNL  Orientation:  oriented to person, place, time/date, situation, day of week, month of year and year  Attention:  Good  Concentration:  Good  Memory:  WNL  Fund of knowledge:   Good  Insight:    Fair  Judgment:   Fair  Impulse Control:  Fair     Risk Assessment: Danger to Self:  No Self-injurious Behavior: No Danger to Others: No Duty to Warn:no Physical Aggression / Violence:No  Access to Firearms a concern: No  Gang Involvement:No    Subjective:  Patient in today with anxiety and some depression, in addition to grief re: recent loss of 3 close friends.  Talked through her issues of loss in detail, realizing how traumatic these losses were so close together.  Seemed to feel better after talking and sharing her emotions.  Has not been as good most recently in following her diet after having bariatric surgery.  Did get sick after eating things that are not on her prescribed diet. Stopped doing her exercises.  Feeling bad about herself and poor choices.  Re-framed some of her "poor choices" and reached a plan as to what needed to happen for her to get back on a more positive track.  Agreed to also start back in reading her book that has been helpful "The Emotional First Aid Kit" (for bariatric surgery patients).   **As of yesterday, has followed her diet and started back on her exercises earlier today.  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    Interventions:  Solution-Oriented/Positive Psychology and Grief Therapy   Diagnosis:   ICD-10-CM   1. Generalized anxiety disorder F41.1      Plan: Plan to see patient in approx. 1-2 weeks to continue goal-directed treatment.   Shanon Ace, LCSW

## 2018-08-13 ENCOUNTER — Ambulatory Visit (INDEPENDENT_AMBULATORY_CARE_PROVIDER_SITE_OTHER): Payer: BLUE CROSS/BLUE SHIELD | Admitting: Psychiatry

## 2018-08-13 DIAGNOSIS — F411 Generalized anxiety disorder: Secondary | ICD-10-CM

## 2018-08-13 NOTE — Progress Notes (Signed)
      [facility] Counselor/Therapist Progress Note   Patient ID: Ana Walker, MRN: [id number]  Date: 08/13/2018  Timespent: 57  minutes   Treatment Type: Individual   Reported Symptoms: anxiety, procrastination   Mental Status Exam:    Appearance:   Casual     Behavior:  Appropriate and Sharing  Motor:  Normal  Speech/Language:   Normal Rate  Affect:  Congruent  Mood:  anxious  Thought process:  normal  Thought content:    WNL  Sensory/Perceptual disturbances:    WNL  Orientation:  oriented to person, place, time/date, situation, day of week, month of year and year  Attention:  Good  Concentration:  Good  Memory:  WNL  Fund of knowledge:   Good  Insight:    Good  Judgment:   Good  Impulse Control:  Good     Risk Assessment: Danger to Self:  No Self-injurious Behavior: No Danger to Others: No Duty to Warn:no Physical Aggression / Violence:No  Access to Firearms a concern: No  Gang Involvement:No    Subjective:  Patient in with symptoms of stress and anxiety.  New job going well.  Edmon CrapeStoll processing past trauma in relationships.  Journaling shared. Self-destructive habits are diminishing and she's following her diet following bariatric surgery.  Concerned re: issue with daughter and how this was heightening her anxiety.  Reviewed strategies that could help patient better manage her stress and anxiety.  Working also on forgiveness issues, as part of her trying to move forward.  Has joined [employer] near her apt and is focusing on taking better care of self.   Interventions: Ego-Supportive and Insight-Oriented   Diagnosis:   ICD-10-CM   1. Generalized anxiety disorder F41.1      Plan: Patient to continue her journaling as that has proven to be helpful to patient in working through past trauma.  Encouraged to continue her work on forgiveness, not to ok certain things that happened, but to help patient let go in order to move forward.  Joined [employer] gym to take better care of self.   Mathis Fareeborah Baylon Santelli, LCSW

## 2018-08-27 ENCOUNTER — Ambulatory Visit (INDEPENDENT_AMBULATORY_CARE_PROVIDER_SITE_OTHER): Payer: BLUE CROSS/BLUE SHIELD | Admitting: Psychiatry

## 2018-08-27 DIAGNOSIS — F411 Generalized anxiety disorder: Secondary | ICD-10-CM | POA: Diagnosis not present

## 2018-08-27 NOTE — Progress Notes (Signed)
      [facility] Counselor/Therapist Progress Note  Patient ID: Ana Walker, MRN: [id number],    Date: 08/27/2018  Time Spent: 60 minutes  Treatment Type: Individual Therapy  Reported Symptoms: Anxious Mood  Mental Status Exam:  Appearance:   Casual     Behavior:  Appropriate and Sharing  Motor:  Normal  Speech/Language:   Normal Rate  Affect:  Congruent  Mood:  anxious  Thought process:  normal  Thought content:    WNL  Sensory/Perceptual disturbances:    WNL  Orientation:  oriented to person, place, time/date, situation, day of week, month of year and year  Attention:  Good  Concentration:  Good  Memory:  WNL  Fund of knowledge:   Good  Insight:    Good  Judgment:   Good  Impulse Control:  Good   Risk Assessment: Danger to Self:  No Self-injurious Behavior: No Danger to Others: No Duty to Warn:no Physical Aggression / Violence:No  Access to Firearms a concern: No  Gang Involvement:No   Subjective:   Patient in today struggling with anxiety and significant personal issues that patient does not want named in personal health record  Discussed the personal issues at length as well as strategies she could use to diffuse her situation some.  Did seem less stressed after talking today.  Has also left her most recent job and has another Copyinterview tomorrow.     Interventions: Solution-Oriented/Positive Psychology and Ego-Supportive  Diagnosis:   ICD-10-CM   1. Generalized anxiety disorder F41.1     Plan:  Patient to follow up on techniques to use in better managing significant personal stressors.  Also urged to improve her own self-care (especially physically and emotionally).   Mathis Fareeborah Linnet Bottari, LCSW

## 2018-08-30 ENCOUNTER — Ambulatory Visit (INDEPENDENT_AMBULATORY_CARE_PROVIDER_SITE_OTHER): Payer: BLUE CROSS/BLUE SHIELD | Admitting: Psychiatry

## 2018-08-30 DIAGNOSIS — F411 Generalized anxiety disorder: Secondary | ICD-10-CM | POA: Diagnosis not present

## 2018-08-30 NOTE — Progress Notes (Signed)
      [facility] Counselor/Therapist Progress Note  Patient ID: Ana Walker, MRN: [id number],    Date: 08/30/2018  Time Spent: 60 minutes   Treatment Type: Individual Therapy  Reported Symptoms: Anxious Mood  Mental Status Exam:  Appearance:   Casual     Behavior:  Appropriate and Sharing  Motor:  Normal  Speech/Language:   Normal Rate  Affect:  Congruent  Mood:  anxious  Thought process:  normal  Thought content:    WNL  Sensory/Perceptual disturbances:    WNL  Orientation:  oriented to person, place, time/date, situation, day of week, month of year and year  Attention:  Good  Concentration:  Good  Memory:  WNL  Fund of knowledge:   Good  Insight:    Good  Judgment:   Good  Impulse Control:  Fair   Risk Assessment: Danger to Self:  No Self-injurious Behavior: No Danger to Others: No Duty to Warn:no Physical Aggression / Violence:No  Access to Firearms a concern: No  Gang Involvement:No   Subjective:  Patient in today with anxiety symptoms, surfacing more in work situations or when she is alone.  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 Shared her concerns and anxiety, and also some triggers to trauma from past, however patient is much better at managing triggers, especially through some journaling.  Actually shared and processed some journaling she did since last appt. which was "cleansing" for her per patient report.  Interventions: Solution-Oriented/Positive Psychology, Ego-Supportive and Insight-Oriented  Diagnosis:   ICD-10-CM   1. Generalized anxiety disorder F41.1     Plan:   Encouraged improved self-care and also to continue journaling which has proven to be helpful for patient.  Will see again in 2-3 wks depending on schedule of her new job.  Mathis Fareeborah Marcedes Tech, LCSW

## 2018-09-03 ENCOUNTER — Ambulatory Visit: Payer: BLUE CROSS/BLUE SHIELD | Admitting: Psychiatry

## 2018-09-10 ENCOUNTER — Ambulatory Visit: Payer: BLUE CROSS/BLUE SHIELD | Admitting: Psychiatry

## 2018-09-16 ENCOUNTER — Ambulatory Visit (INDEPENDENT_AMBULATORY_CARE_PROVIDER_SITE_OTHER): Payer: BLUE CROSS/BLUE SHIELD | Admitting: Psychiatry

## 2018-09-16 DIAGNOSIS — F411 Generalized anxiety disorder: Secondary | ICD-10-CM

## 2018-09-16 NOTE — Progress Notes (Signed)
      [facility] Counselor/Therapist Progress Note  Patient ID: Ana Walker, MRN: [id number],    Date: 09/16/2018  Time Spent: 58  minutes  Treatment Type: Individual Therapy  Reported Symptoms:  Anxiety, stressed  Mental Status Exam:  Appearance:   Casual     Behavior:  Appropriate and Sharing  Motor:  Normal  Speech/Language:   Normal Rate  Affect:  Congruent  Mood:  anxious  Thought process:  normal  Thought content:    WNL  Sensory/Perceptual disturbances:    WNL  Orientation:  oriented to person, place, time/date, situation, day of week, month of year and year  Attention:  Good  Concentration:  Good  Memory:  WNL  Fund of knowledge:   Good  Insight:    Good  Judgment:   Good  Impulse Control:  Fair   Risk Assessment: Danger to Self:  No Self-injurious Behavior: No Danger to Others: No Duty to Warn:no Physical Aggression / Violence:No  Access to Firearms a concern: No  Gang Involvement:No   Subjective:  Patient in today with anxiety and stressed after recently starting a [place] job [place], a place where she worked several yrs ago.  Reports work is very stressful but things are going well so far. Continues working to let go of some negative relationships and occurrences from her past. Is using some journaling and sharing as a tool to let go. Processes what she feels are mistakes on her part and is working on forgiving herself.  Does acknowledge some of the progress she has made in some areas of her personal life. Encouraged self-care, especially with her bariatric diet, and to continue her journaling which is definitely offering her some healing.  Goal review and progress noted.  Interventions: Solution-Oriented/Positive Psychology and Ego-Supportive  Diagnosis:   ICD-10-CM   1. Generalized anxiety disorder F41.1     Plan:  Patient to be very careful in following her bariatric diet, following her surgery earlier this year.  To continue journaling  as mentioned above, and focus on self-care ( physically, mentally, emotionally, and nutritionally).     Mathis Fareeborah Sheba Whaling, LCSW

## 2018-09-23 ENCOUNTER — Ambulatory Visit: Payer: BLUE CROSS/BLUE SHIELD | Admitting: Psychiatry

## 2018-12-11 DIAGNOSIS — J309 Allergic rhinitis, unspecified: Secondary | ICD-10-CM | POA: Diagnosis not present

## 2018-12-11 DIAGNOSIS — F419 Anxiety disorder, unspecified: Secondary | ICD-10-CM | POA: Diagnosis not present

## 2018-12-11 DIAGNOSIS — Z6841 Body Mass Index (BMI) 40.0 and over, adult: Secondary | ICD-10-CM | POA: Diagnosis not present

## 2018-12-17 DIAGNOSIS — R0600 Dyspnea, unspecified: Secondary | ICD-10-CM | POA: Diagnosis not present

## 2018-12-17 DIAGNOSIS — Z6841 Body Mass Index (BMI) 40.0 and over, adult: Secondary | ICD-10-CM | POA: Diagnosis not present

## 2018-12-17 DIAGNOSIS — R42 Dizziness and giddiness: Secondary | ICD-10-CM | POA: Diagnosis not present

## 2018-12-17 DIAGNOSIS — Z79899 Other long term (current) drug therapy: Secondary | ICD-10-CM | POA: Diagnosis not present

## 2018-12-17 DIAGNOSIS — R197 Diarrhea, unspecified: Secondary | ICD-10-CM | POA: Diagnosis not present

## 2018-12-17 DIAGNOSIS — R05 Cough: Secondary | ICD-10-CM | POA: Diagnosis not present

## 2018-12-17 DIAGNOSIS — B349 Viral infection, unspecified: Secondary | ICD-10-CM | POA: Diagnosis not present

## 2018-12-26 ENCOUNTER — Ambulatory Visit (INDEPENDENT_AMBULATORY_CARE_PROVIDER_SITE_OTHER): Payer: BLUE CROSS/BLUE SHIELD | Admitting: Psychiatry

## 2018-12-26 DIAGNOSIS — F411 Generalized anxiety disorder: Secondary | ICD-10-CM

## 2018-12-26 NOTE — Progress Notes (Addendum)
      [facility] Counselor/Therapist Progress Note  Patient ID: Ana Walker, MRN: [id number],    Date: 12/26/2018  Time Spent: 60  Minutes 12:00noon to 1:00pm  Treatment Type: Individual Therapy   Virtual Visit via Telephone Note I connected with patient by a video enabled telemedicine application or telephone, with their informed consent, and verified patient privacy and that I am speaking with the correct person using two identifiers. I am at [facility] and patient is at home.   I discussed the limitations, risks, security and privacy concerns of performing psychotherapy and management service by telephone and the availability of in person appointments. I also discussed with the patient that there may be a patient responsible charge related to this service. The patient expressed understanding and agreed to proceed.  I discussed the treatment planning with the patient. The patient was provided an opportunity to ask questions and all were answered. The patient agreed with the plan and demonstrated an understanding of the instructions.   The patient was advised to call  our office if  symptoms worsen or feel they are in a crisis state and need immediate contact.   Reported Symptoms:  Anxiety, stressed  Mental Status Exam:  Appearance:     n/a  Behavior:  Sharing  Motor:  Normal  Speech/Language:   Normal Rate  Affect:  n/a  Mood:  anxious  Thought process:  normal  Thought content:    WNL  Sensory/Perceptual disturbances:    WNL  Orientation:  oriented to person, place, time/date, situation, day of week, month of year and year  Attention:  Good  Concentration:  Good  Memory:  WNL  Fund of knowledge:   Good  Insight:    Good  Judgment:   Good  Impulse Control:  Fair   Risk Assessment: Danger to Self:  No Self-injurious Behavior: No Danger to Others: No Duty to Warn:no Physical Aggression / Violence:No  Access to Firearms a concern: No  Gang  Involvement:No   Subjective:  Patient is noticeably anxidous today and stressed     after recently starting a new job in [place], a place where she worked several yrs ago.  Reports work is very stressful but things are going well so far. Continues working to let go of some negative relationships and occurrences from her past. Is using some journaling and sharing as a tool to let go. Processes what she feels are mistakes on her part and is working on forgiving herself.  Does acknowledge some of the progress she has made in some areas of her personal life. Encouraged self-care, especially with her bariatric diet, and to continue her journaling which is definitely offering her some healing.  Goal review and progress noted.  Interventions: Solution-Oriented/Positive Psychology and Ego-Supportive  Diagnosis:   ICD-10-CM   1. Generalized anxiety disorder F41.1     Plan:  Patient to be very careful in following her bariatric diet, following her surgery earlier this year.  To continue journaling as mentioned above, and focus on self-care ( physically, mentally, emotionally, and nutritionally).     Mathis Fare, LCSW

## 2019-01-10 ENCOUNTER — Other Ambulatory Visit: Payer: Self-pay

## 2019-01-10 ENCOUNTER — Ambulatory Visit (INDEPENDENT_AMBULATORY_CARE_PROVIDER_SITE_OTHER): Payer: BLUE CROSS/BLUE SHIELD | Admitting: Psychiatry

## 2019-01-10 DIAGNOSIS — F411 Generalized anxiety disorder: Secondary | ICD-10-CM | POA: Diagnosis not present

## 2019-01-10 NOTE — Progress Notes (Addendum)
[facility] Counselor/Therapist Progress Note  Patient ID: Ana Walker, MRN: [id number],    Date: 01/10/2019  Time Spent: 60  Minutes 9:00am to 10:00am  Treatment Type: Individual Therapy   Virtual Visit  Note I connected with patient by a video enabled telemedicine application or telephone, with their informed consent, and verified patient privacy and that I am speaking with the correct person using two identifiers. I am at [facility] and patient is at home.   I discussed the limitations, risks, security and privacy concerns of performing psychotherapy and management service by telephone and the availability of in person appointments. I also discussed with the patient that there may be a patient responsible charge related to this service. The patient expressed understanding and agreed to proceed.  I discussed the treatment planning with the patient. The patient was provided an opportunity to ask questions and all were answered. The patient agreed with the plan and demonstrated an understanding of the instructions.   The patient was advised to call  our office if  symptoms worsen or feel they are in a crisis state and need immediate contact.   Reported Symptoms:  Anxiety, stressed, intermittent sleep difficultites  Mental Status Exam:  Appearance:     n/a  Behavior:  Sharing  Motor:  Normal  Speech/Language:   Normal Rate  Affect:  n/a  Mood:  anxious  Thought process:  normal  Thought content:    WNL  Sensory/Perceptual disturbances:    WNL  Orientation:  oriented to person, place, time/date, situation, day of week, month of year and year  Attention:  Good  Concentration:  Good  Memory:  WNL  Fund of knowledge:   Good  Insight:    Good  Judgment:   Good  Impulse Control:  Fair   Risk Assessment: Danger to Self:  No Self-injurious Behavior: No Danger to Others: No Duty to Warn:no Physical Aggression / Violence:No  Access to Firearms a concern:  No  Gang Involvement:No   Subjective:  Patient is continues to have anxiety and stress, a lot of which she feels is related to the coronavirus situation.  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  Still working on processing what she feels are mistakes on her part and is working on forgiving herself.  Patient reports that she can see some of the progress she has made in some areas of her personal life and that helps her confidence.   Discussed prior goals patient was already focusing on and have merged them to fit in with now documented goals in Flowsheet section of Epic chart. Is making strides in her understanding of how some of her beliefs and especially her self-talk feeds her anxiety and "worries".  Encouraged self-care, especially to follow her post-surgical bariatric diet, and to continue her journaling which is definitely offering her some healing.  Has friends and her church is supportive and that feels helpful to her.  Interventions: Solution-Oriented/Positive Psychology and Ego-Supportive  Diagnosis:   ICD-10-CM   1. Generalized anxiety disorder F41.1     Plan:  Patient to be more careful in  following her bariatric diet, following her surgery last year.  Had lost 92lbs and gained back 12.  States she is working hard to get back on healthier track. To continue journaling as mentioned above, and focus on self-care ( physically, mentally, emotionally, and nutritionally).     [name], LCSW

## 2019-01-17 ENCOUNTER — Other Ambulatory Visit: Payer: Self-pay

## 2019-01-17 ENCOUNTER — Ambulatory Visit (INDEPENDENT_AMBULATORY_CARE_PROVIDER_SITE_OTHER): Payer: BLUE CROSS/BLUE SHIELD | Admitting: Psychiatry

## 2019-01-17 DIAGNOSIS — F411 Generalized anxiety disorder: Secondary | ICD-10-CM

## 2019-01-17 NOTE — Progress Notes (Signed)
[facility] Counselor/Therapist Progress Note  Patient ID: Ana Walker, MRN: [id number],    Date: 01/17/2019  Time Spent: 63  Minutes 8:00am to 9:03am  Treatment Type: Individual Therapy   Virtual Visit  Note I connected with patient by a video enabled telemedicine/telehealth application or telephone, with their informed consent, and verified patient privacy and that I am speaking with the correct person using two identifiers. [facility] and patient is at home.   I discussed the limitations, risks, security and privacy concerns of performing psychotherapy and management service by telephone and the availability of in person appointments. I also discussed with the patient that there may be a patient responsible charge related to this service. The patient expressed understanding and agreed to proceed.  I discussed the treatment planning with the patient. The patient was provided an opportunity to ask questions and all were answered. The patient agreed with the plan and demonstrated an understanding of the instructions.   The patient was advised to call  our office if  symptoms worsen or feel they are in a crisis state and need immediate contact.   Reported Symptoms:  Anxiety, stressed, intermittent sleep difficultites  Mental Status Exam:  Appearance:     N/A   (telehealth)  Behavior:  Sharing  Motor:  Normal  Speech/Language:   Normal Rate  Affect:  N/A  (telehealth)  Mood:  anxious  Thought process:  normal  Thought content:    WNL  Sensory/Perceptual disturbances:    WNL  Orientation:  oriented to person, place, time/date, situation, day of week, month of year and year  Attention:  Good  Concentration:  Good  Memory:  WNL  Fund of knowledge:   Good  Insight:    Good  Judgment:   Good  Impulse Control:  Fair   Risk Assessment: Danger to Self:  No Self-injurious Behavior: No Danger to Others: No Duty to Warn:no Physical Aggression /  Violence:No  Access to Firearms a concern: No  Gang Involvement:No   Subjective:  Patient is continues to experience anxiety and stress which has increased some past week due to coronavirus issues and 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  Patient's anxiety and stress level peaked to where she took a "mental health day" today, which is unusual for patient.  Irritable, restless, excessive worry, difficulty concentrating, "anxiety attacks" but is able to eventually calm herself, and fatigue although she reports she is sleeping with her C-pap machine.  Is doing better with sticking to her post-bariatric surgery diet. Also discussed how some of the work relationship issues parallel in some ways to some of patient's difficult past.   Discussed strategies for better managing her stress and anxiety including: Deep breathing exercises Healthy eating choices Physical exercise daily/ walking/yoga/weights Get outside daily Pause and Pray (as this is important to patient) Good sleep habits Limit caffeine Meditation Reading inspirational books Journaling Music    Patient very receptive and reports feeling better talking things through at length and feeling heard.  Encouraged her to use the above strategies as she focused more on overall self-care.  Plans to return to work on Monday, April 27th.     Interventions: Solution-Oriented/Positive Psychology and Ego-Supportive  Diagnosis:   ICD-10-CM   1. Generalized anxiety disorder F41.1  Plan:  Patient to be more careful in following her bariatric diet, following her surgery last year.  Had lost 92lbs and gained back 12. Also to follow through on strategies list above to manage stress and anxiety more effectively.  States she is working hard to get back on healthier  track. To continue journaling as mentioned above, and focus on self-care ( physically, mentally, emotionally, and nutritionally).     Mathis Fare, LCSW

## 2019-01-21 ENCOUNTER — Other Ambulatory Visit: Payer: Self-pay

## 2019-01-21 ENCOUNTER — Ambulatory Visit (INDEPENDENT_AMBULATORY_CARE_PROVIDER_SITE_OTHER): Payer: BLUE CROSS/BLUE SHIELD | Admitting: Psychiatry

## 2019-01-21 DIAGNOSIS — F411 Generalized anxiety disorder: Secondary | ICD-10-CM

## 2019-01-21 NOTE — Progress Notes (Signed)
[facility] Counselor/Therapist Progress Note  Patient ID: LUBNA VIALPANDO, MRN: [id number],    Date: 01/21/2019  Time Spent: 60  Minutes 12noon  to 1:00pm  Treatment Type: Individual Therapy   Virtual Visit  Note I connected with patient by a video enabled telemedicine/telehealth application or telephone, with their informed consent, and verified patient privacy and that I am speaking with the correct person using two identifiers. I am at [facility] and patient is at home.   I discussed the limitations, risks, security and privacy concerns of performing psychotherapy and management service by telephone and the availability of in person appointments. I also discussed with the patient that there may be a patient responsible charge related to this service. The patient expressed understanding and agreed to proceed.  I discussed the treatment planning with the patient. The patient was provided an opportunity to ask questions and all were answered. The patient agreed with the plan and demonstrated an understanding of the instructions.   The patient was advised to call  our office if  symptoms worsen or feel they are in a crisis state and need immediate contact.   Reported Symptoms:  Anxiety, increased stress   Mental Status Exam:  Appearance:     N/A   (telehealth)  Behavior:  Sharing  Motor:  Normal  Speech/Language:   Normal Rate  Affect:  N/A  (telehealth)  Mood:  anxious  Thought process:  normal  Thought content:    WNL  Sensory/Perceptual disturbances:    WNL  Orientation:  oriented to person, place, time/date, situation, day of week, month of year and year  Attention:  Good  Concentration:  Good  Memory:  WNL  Fund of knowledge:   Good  Insight:    Good  Judgment:   Good  Impulse Control:  Fair   Risk Assessment: Danger to Self:  No Self-injurious Behavior: No Danger to Others: No Duty to Warn:no Physical Aggression / Violence:No  Access to  Firearms a concern: No  Gang Involvement:No   Subjective:  Patient continues to experience anxiety and stress which has increased some past week due to unexpected family situations and the coronavirus issues which has caused 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  Shared about the family situation that has become problematic and concerns patient a lot.  Difficulty concentrating, increased anxiety and stress, but is able to eventually calm herself. Continues doing better with sticking to her post-bariatric surgery diet. Due to her surge in anxiety, we reviewed some strategies discussed before to help manage her anxiety and stress more effectively:  Deep breathing exercises Healthy eating choices Physical exercise daily/ walking/yoga/weights Get outside daily Pause and Pray (as this is important to patient) Good sleep habits Limit caffeine Meditation Reading inspirational books Journaling Music    Patient continues to be receptive and reports feeling better talking things through at length and feeling heard.  Encouraged her to use the above strategies as she focused more on overall self-care and her anxiety and stress.     Interventions: Solution-Oriented/Positive Psychology and Ego-Supportive  Diagnosis:   ICD-10-CM   1. Generalized anxiety disorder F41.1     Plan:  Patient to continue better following her bariatric diet, following her surgery last year.  Also to follow through on strategies listed above to manage stress and anxiety more effectively. Is working hard to get back on healthier track. To continue journaling (which has helped her on prior issues)  as mentioned above, and focus on self-care ( physically, mentally, emotionally, and nutritionally).     Mathis Fareeborah Jesscia Imm, LCSW

## 2019-01-25 DIAGNOSIS — J40 Bronchitis, not specified as acute or chronic: Secondary | ICD-10-CM | POA: Diagnosis not present

## 2019-01-25 DIAGNOSIS — Z79899 Other long term (current) drug therapy: Secondary | ICD-10-CM | POA: Diagnosis not present

## 2019-01-25 DIAGNOSIS — Z20828 Contact with and (suspected) exposure to other viral communicable diseases: Secondary | ICD-10-CM | POA: Diagnosis not present

## 2019-01-25 DIAGNOSIS — R0600 Dyspnea, unspecified: Secondary | ICD-10-CM | POA: Diagnosis not present

## 2019-01-25 DIAGNOSIS — G4730 Sleep apnea, unspecified: Secondary | ICD-10-CM | POA: Diagnosis not present

## 2019-01-25 DIAGNOSIS — R0989 Other specified symptoms and signs involving the circulatory and respiratory systems: Secondary | ICD-10-CM | POA: Diagnosis not present

## 2019-01-25 DIAGNOSIS — R0602 Shortness of breath: Secondary | ICD-10-CM | POA: Diagnosis not present

## 2019-01-25 DIAGNOSIS — Z8701 Personal history of pneumonia (recurrent): Secondary | ICD-10-CM | POA: Diagnosis not present

## 2019-01-28 DIAGNOSIS — U071 COVID-19: Secondary | ICD-10-CM | POA: Diagnosis not present

## 2019-01-28 DIAGNOSIS — Z6841 Body Mass Index (BMI) 40.0 and over, adult: Secondary | ICD-10-CM | POA: Diagnosis not present

## 2019-01-29 DIAGNOSIS — R05 Cough: Secondary | ICD-10-CM | POA: Diagnosis not present

## 2019-01-29 DIAGNOSIS — R0600 Dyspnea, unspecified: Secondary | ICD-10-CM | POA: Diagnosis not present

## 2019-01-29 DIAGNOSIS — U071 COVID-19: Secondary | ICD-10-CM | POA: Diagnosis not present

## 2019-01-30 ENCOUNTER — Ambulatory Visit (INDEPENDENT_AMBULATORY_CARE_PROVIDER_SITE_OTHER): Payer: BLUE CROSS/BLUE SHIELD | Admitting: Psychiatry

## 2019-01-30 ENCOUNTER — Other Ambulatory Visit: Payer: Self-pay

## 2019-01-30 DIAGNOSIS — F411 Generalized anxiety disorder: Secondary | ICD-10-CM

## 2019-01-30 NOTE — Progress Notes (Signed)
[facility] Counselor/Therapist Progress Note  Patient ID: Ana Walker, MRN: [id number],    Date: 01/30/2019  Time Spent: 60  Minutes 8:00am to 9:00am  Treatment Type: Individual Therapy   Virtual Visit  Note I connected with patient by a video enabled telemedicine/telehealth application or telephone, with their informed consent, and verified patient privacy and that I am speaking with the correct person using two identifiers. I am at [facility] and patient is at home.   I discussed the limitations, risks, security and privacy concerns of performing psychotherapy and management service by telephone and the availability of in person appointments. I also discussed with the patient that there may be a patient responsible charge related to this service. The patient expressed understanding and agreed to proceed.  I discussed the treatment planning with the patient. The patient was provided an opportunity to ask questions and all were answered. The patient agreed with the plan and demonstrated an understanding of the instructions.   The patient was advised to call  our office if  symptoms worsen or feel they are in a crisis state and need immediate contact.   Reported Symptoms:  Anxiety, increased stress, physical health concern  Mental Status Exam:  Appearance:     N/A   (telehealth)  Behavior:  Sharing  Motor:  Normal  Speech/Language:   Normal Rate  Affect:  N/A  (telehealth)  Mood:  anxious  Thought process:  normal  Thought content:    WNL  Sensory/Perceptual disturbances:    WNL  Orientation:  oriented to person, place, time/date, situation, day of week, month of year and year  Attention:  Good  Concentration:  Good  Memory:  WNL  Fund of knowledge:   Good  Insight:    Good  Judgment:   Good  Impulse Control:  Fair   Risk Assessment: Danger to Self:  No Self-injurious Behavior: No Danger to Others: No Duty to Warn:no Physical Aggression /  Violence:No  Access to Firearms a concern: No  Gang Involvement:No   Subjective:   Patient experiencing increased anxiety and stress. She has just recently been diagnosed with COVID-19 virus and is appropriately concerned and needing support today. Also some stress due to family situations discussed last session.  Having difficulty concentrating with her increased anxiety and stress but after talking through a lot of her concerns, she became calmer and actually laughed at herself a bit.  Has good support with her friends and church friends. These same people are available to her if she needs anything.  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  Seems to have a good mindset at this point.  Plans to keep her list below in mind to help guide her choices:  Deep breathing exercises Healthy eating choices Physical exercise daily/ walking/yoga/weights Get outside daily Pause and Pray (as this is important to patient) Good sleep habits Limit caffeine Meditation Reading inspirational books Journaling Music    Patient continues to be receptive and reports feeling better talking things through at length and feeling heard.  Encouraged her to use the above strategies as she focused more on overall self-care and her anxiety and stress.  Interventions: Solution-Oriented/Positive Psychology and Ego-Supportive  Diagnosis:   ICD-10-CM   1. Generalized anxiety disorder F41.1     Plan:  Patient to continue better following her bariatric diet, following her surgery last year.  Also to follow through on strategies listed above to manage stress and anxiety more effectively. Is working hard to get back on healthier track. To continue  journaling (which has helped her on prior issues) as mentioned above, and focus on self-care ( physically, mentally, emotionally, and nutritionally).     Mathis Fareeborah Maylani Embree, LCSW

## 2019-02-05 ENCOUNTER — Other Ambulatory Visit: Payer: Self-pay

## 2019-02-05 ENCOUNTER — Ambulatory Visit (INDEPENDENT_AMBULATORY_CARE_PROVIDER_SITE_OTHER): Payer: BLUE CROSS/BLUE SHIELD | Admitting: Psychiatry

## 2019-02-05 DIAGNOSIS — F411 Generalized anxiety disorder: Secondary | ICD-10-CM

## 2019-02-05 NOTE — Progress Notes (Signed)
[facility] Counselor/Therapist Progress Note  Patient ID: Ana Walker, MRN: [id number],    Date: 02/05/2019  Time Spent: 60  Minutes 9:00am to 10:00am  Treatment Type: Individual Therapy   Virtual Visit Note I connected with patient by a video enabled telemedicine/telehealth application or telephone, with their informed consent, and verified patient privacy and that I am speaking with the correct person using two identifiers. [facility] at [facility] and patient is at home.   I discussed the limitations, risks, security and privacy concerns of performing psychotherapy and management service by telephone and the availability of in person appointments. I also discussed with the patient that there may be a patient responsible charge related to this service. The patient expressed understanding and agreed to proceed.  I discussed the treatment planning with the patient. The patient was provided an opportunity to ask questions and all were answered. The patient agreed with the plan and demonstrated an understanding of the instructions.   The patient was advised to call  our office if  symptoms worsen or feel they are in a crisis state and need immediate contact.   Reported Symptoms:  Anxiety, increased stress, physical health concern  Mental Status Exam:  Appearance:     N/A   (telehealth)  Behavior:  Sharing  Motor:  Normal  Speech/Language:   Normal Rate  Affect:  N/A  (telehealth)  Mood:  anxious  Thought process:  normal  Thought content:    WNL  Sensory/Perceptual disturbances:    WNL  Orientation:  oriented to person, place, time/date, situation, day of week, month of year and year  Attention:  Good  Concentration:  Good  Memory:  WNL  Fund of knowledge:   Good  Insight:    Good  Judgment:   Good  Impulse Control:  Fair   Risk Assessment: Danger to Self:  No Self-injurious Behavior: No Danger to Others: No Duty to Warn:no Physical Aggression /  Violence:No  Access to Firearms a concern: No  Gang Involvement:No   Subjective:   Patient continues to experience heightened anxiety and stress, especially due to current health situation.  She has recently been diagnosed with COVID-19 virus and is appropriately concerned and needing/getting support during this time. Only has told a few of her closer friends at this point. Processed her Covid-19 experience at length and what all this has been like for her.  Seemed calmer after talking and looking forward to getting better and eventually able to get out, but is quick to say she is going to be very careful and take all precautions.  Doesn't have to go anywhere soon so can just focus on good self-care and continued quarantining herself at home.  Encouraged the good self-care, reaching out as she needs or wants to friends for support and not to feel alone.  Find that her strong faith, network of friends, and sense of humor are all helping her. Also urged to stick very carefully with the special diet she is to be on since her bariatric surgery. Trying not to focus on family situational stressors right now and just focus on taking care of herself in order to get well and move forward.   Reviewed some strategies to help manage her anxiety and stress, as well as setting appropriate limits so as to use this time to heal.  Continues to have a good mindset at this point.  *Plans to keep her list below in mind to help guide her  choices:  Deep breathing exercises Healthy eating choices Physical exercise daily/ walking/yoga/weights Get outside daily Pause and Pray (as this is important to patient) Good sleep habits Limit caffeine Meditation Reading inspirational books Journaling Music    Interventions: Solution-Oriented/Positive Psychology and Ego-Supportive  Diagnosis:   ICD-10-CM   1. Generalized anxiety disorder F41.1     Plan:  Patient to continue better following her bariatric diet, following  her surgery last year.  Also to follow through on strategies listed above to manage stress and anxiety more effectively. Is working hard to get back on healthier track. To get back to her journaling (which has helped her on prior issues) as mentioned above, and focus on self-care ( physically, mentally, emotionally, and nutritionally).     Mathis Fare, LCSW

## 2019-02-12 ENCOUNTER — Ambulatory Visit (INDEPENDENT_AMBULATORY_CARE_PROVIDER_SITE_OTHER): Payer: BLUE CROSS/BLUE SHIELD | Admitting: Psychiatry

## 2019-02-12 ENCOUNTER — Other Ambulatory Visit: Payer: Self-pay

## 2019-02-12 DIAGNOSIS — F419 Anxiety disorder, unspecified: Secondary | ICD-10-CM | POA: Diagnosis not present

## 2019-02-12 DIAGNOSIS — F411 Generalized anxiety disorder: Secondary | ICD-10-CM | POA: Diagnosis not present

## 2019-02-12 NOTE — Progress Notes (Signed)
[facility] Counselor/Therapist Progress Note  Patient ID: HSER AUTIO, MRN: [id number],    Date: 02/12/2019  Time Spent: 60  Minutes 9:00am to 10:00am  Treatment Type: Individual Therapy   Virtual Visit Note I connected with patient by a video enabled telemedicine/telehealth application or telephone, with their informed consent, and verified patient privacy and that I am speaking with the correct person using two identifiers. I am at [facility] and patient is at home.   I discussed the limitations, risks, security and privacy concerns of performing psychotherapy and management service by telephone and the availability of in person appointments. I also discussed with the patient that there may be a patient responsible charge related to this service. The patient expressed understanding and agreed to proceed.  I discussed the treatment planning with the patient. The patient was provided an opportunity to ask questions and all were answered. The patient agreed with the plan and demonstrated an understanding of the instructions.   The patient was advised to call  our office if  symptoms worsen or feel they are in a crisis state and need immediate contact.   Reported Symptoms:  Increased anxiety, increased stress, physical health concern  Mental Status Exam:  Appearance:     N/A   (telehealth)  Behavior:  Sharing  Motor:  Normal  Speech/Language:   Normal Rate  Affect:  N/A  (telehealth)  Mood:  anxious  Thought process:  normal  Thought content:    WNL  Sensory/Perceptual disturbances:    WNL  Orientation:  oriented to person, place, time/date, situation, day of week, month of year and year  Attention:  Good  Concentration:  Good  Memory:  WNL  Fund of knowledge:   Good  Insight:    Good  Judgment:   Good  Impulse Control:  Fair   Risk Assessment: Danger to Self:  No Self-injurious Behavior: No Danger to Others: No Duty to Warn:no Physical  Aggression / Violence:No  Access to Firearms a concern: No  Gang Involvement:No   Subjective:   Patient very concerned about her work environment after being out sick.  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  She was recently been diagnosed with COVID-19 virus and is appropriately concerned and needing/getting support during this time. She has progressed and been recovering.  Just released to return to work by [employer] ([employer]) .Is very emotional, frustrated, concerned, anxious, and stressed, mostly re: the  Coronavirus situation and having had it recently.  Most of her (behavioral health) symptoms recently have increased due to her personal an environmental factors. Talked through her various stressors, giving her opportunity to freely vent, and also to sort through what she has to respond to and what she needs to let go of, what is in her control and what is not, and not "looking for the worst to happen".  Also reviewed strategies to help her better manage her anxiety and stress, and also to help her re-frame situations that can help her feel more empowered and also take better care of herself.     Finds that her strong faith, therapy,network of friends, and sense of humor are all helping her. Also urged to stick very carefully with the special diet she is to be on since her bariatric surgery.   *Plans to keep her list below in  mind to help guide her choices: Deep breathing exercises Healthy eating choices Physical exercise daily/ walking/yoga/weights Get outside daily Pause and Pray (as this is important to patient) Good sleep habits Limit caffeine Meditation Reading inspirational books Journaling Music    Interventions: Solution-Oriented/Positive Psychology and Ego-Supportive  Diagnosis:   ICD-10-CM   1. Generalized anxiety disorder F41.1      Plan:  Patient to continue better following her bariatric diet, following her surgery last year.  Also to follow through on strategies listed above to manage stress and anxiety more effectively. Is working hard to get back on healthier track. To continue her journaling (which has helped her on prior issues) as mentioned above, and focus on self-care ( physically, mentally, emotionally, and nutritionally).     Mathis Fareeborah Blease Capaldi, LCSW

## 2019-02-14 DIAGNOSIS — U071 COVID-19: Secondary | ICD-10-CM | POA: Diagnosis not present

## 2019-02-15 DIAGNOSIS — U071 COVID-19: Secondary | ICD-10-CM | POA: Diagnosis not present

## 2019-02-19 ENCOUNTER — Other Ambulatory Visit: Payer: Self-pay

## 2019-02-19 ENCOUNTER — Ambulatory Visit (INDEPENDENT_AMBULATORY_CARE_PROVIDER_SITE_OTHER): Payer: BLUE CROSS/BLUE SHIELD | Admitting: Psychiatry

## 2019-02-19 DIAGNOSIS — F411 Generalized anxiety disorder: Secondary | ICD-10-CM | POA: Diagnosis not present

## 2019-02-19 NOTE — Progress Notes (Signed)
[facility] Counselor/Therapist Progress Note  Patient ID: NYOMIE LOZO, MRN: [id number],    Date: 02/19/2019  Time Spent: 60  Minutes 9:00am to 10:00am  Treatment Type: Individual Therapy   Virtual Visit Note I connected with patient by a video enabled telemedicine/telehealth application or telephone, with their informed consent, and verified patient privacy and that I am speaking with the correct person using two identifiers. I am at [facility] and patient is at home.   I discussed the limitations, risks, security and privacy concerns of performing psychotherapy and management service by telephone and the availability of in person appointments. I also discussed with the patient that there may be a patient responsible charge related to this service. The patient expressed understanding and agreed to proceed.  I discussed the treatment planning with the patient. The patient was provided an opportunity to ask questions and all were answered. The patient agreed with the plan and demonstrated an understanding of the instructions.   The patient was advised to call  our office if  symptoms worsen or feel they are in a crisis state and need immediate contact.   Reported Symptoms:  Increased anxiety, increased stress, physical health concern repeated  Mental Status Exam:  Appearance:     N/A   (telehealth)  Behavior:  Sharing  Motor:  Normal (per patient)  (telehealth)  Speech/Language:   Normal Rate  Affect:  N/A  (telehealth)  Mood:  anxious  Thought process:  normal  Thought content:    WNL  Sensory/Perceptual disturbances:    WNL  Orientation:  oriented to person, place, time/date, situation, day of week, month of year and year  Attention:  Good  Concentration:  Good  Memory:  WNL  Fund of knowledge:   Good  Insight:    Good  Judgment:   Good  Impulse Control:  Fair   Risk Assessment: Danger to Self:  No Self-injurious Behavior: No Danger to Others:  No Duty to Warn:no Physical Aggression / Violence:No  Access to Firearms a concern: No  Gang Involvement:No   Subjective:  Patient again is very concerned about her work environment as she is now sick again and out of work again.  She  continues to experience heightened anxiety and stress, especially due to this being her second bout of illness within past few weeks. Is very emotional, frustrated, concerned, anxious, and stressed, mostly re: the  Coronavirus situation and having been sick twice at this point.  Most of her (behavioral health) symptoms recently have increased due to her personal an environmental factors. Talked through her various stressors, giving her opportunity to freely vent, and also to sort through what she has to respond to and what she needs to let go of, what is in her control and what is not. Also reviewed strategies to help her better manage her anxiety and stress, and helped her re-frame situations that can help her feel more empowered and also take better care of herself. Has not been taking as good care of herself overall physically so encouraged her to make healthier choices and be motivated to continue this.   *Plans to keep her list below in mind to help guide her choices: Deep breathing exercises Healthy eating choices Physical exercise daily/ walking/yoga/weights Get outside daily Pause and Pray (as this is important to patient) Good sleep habits Limit caffeine Meditation Reading inspirational books Journaling Music    Interventions: Solution-Oriented/Positive Psychology and Ego-Supportive  Diagnosis:   ICD-10-CM  1. Generalized anxiety disorder F41.1     Plan:  Patient to continue better following her bariatric diet, following her surgery last year.  Also to follow through on strategies listed above to manage stress and anxiety more effectively. Is working hard to get back on healthier track. To continue her journaling (which has helped her on prior  issues) as mentioned above, and focus on self-care ( physically, mentally, emotionally, and nutritionally).     Mathis Fareeborah Somer Trotter, LCSW

## 2019-02-24 DIAGNOSIS — R0600 Dyspnea, unspecified: Secondary | ICD-10-CM | POA: Diagnosis not present

## 2019-02-24 DIAGNOSIS — Z6841 Body Mass Index (BMI) 40.0 and over, adult: Secondary | ICD-10-CM | POA: Diagnosis not present

## 2019-02-24 DIAGNOSIS — R05 Cough: Secondary | ICD-10-CM | POA: Diagnosis not present

## 2019-02-24 DIAGNOSIS — Z03818 Encounter for observation for suspected exposure to other biological agents ruled out: Secondary | ICD-10-CM | POA: Diagnosis not present

## 2019-02-25 ENCOUNTER — Ambulatory Visit (INDEPENDENT_AMBULATORY_CARE_PROVIDER_SITE_OTHER): Payer: BC Managed Care – PPO | Admitting: Psychiatry

## 2019-02-25 ENCOUNTER — Other Ambulatory Visit: Payer: Self-pay

## 2019-02-25 DIAGNOSIS — F411 Generalized anxiety disorder: Secondary | ICD-10-CM
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
## 2019-03-03 ENCOUNTER — Ambulatory Visit: Payer: BLUE CROSS/BLUE SHIELD | Admitting: Psychiatry

## 2019-03-03 ENCOUNTER — Other Ambulatory Visit: Payer: Self-pay

## 2019-03-03 ENCOUNTER — Ambulatory Visit (INDEPENDENT_AMBULATORY_CARE_PROVIDER_SITE_OTHER): Payer: BC Managed Care – PPO | Admitting: Psychiatry

## 2019-03-03 ENCOUNTER — Ambulatory Visit: Payer: BC Managed Care – PPO | Admitting: Psychiatry

## 2019-03-03 DIAGNOSIS — F411 Generalized anxiety disorder: Secondary | ICD-10-CM | POA: Diagnosis not present

## 2019-03-03 NOTE — Progress Notes (Signed)
[facility] Counselor/Therapist Progress Note  Patient ID: Ana Walker, MRN: [id number],    Date: 03/03/2019  Time Spent: 60  Minutes 7:55am to 8:55am  Treatment Type: Individual Therapy   Virtual Visit Note I connected with patient by a video enabled telemedicine/telehealth application or telephone, with their informed consent, and verified patient privacy and that I am speaking with the correct person using two identifiers. I am at [facility] and patient is at home.   I discussed the limitations, risks, security and privacy concerns of performing psychotherapy and management service by telephone and the availability of in person appointments. I also discussed with the patient that there may be a patient responsible charge related to this service. The patient expressed understanding and agreed to proceed.  I discussed the treatment planning with the patient. The patient was provided an opportunity to ask questions and all were answered. The patient agreed with the plan and demonstrated an understanding of the instructions.   The patient was advised to call  our office if  symptoms worsen or feel they are in a crisis state and need immediate contact.   Reported Symptoms:  anxiety, stress, physical health concern repeated  Mental Status Exam:  Appearance:     N/A   (telehealth)  Behavior:  Sharing  Motor:  Normal   (telehealth)  Speech/Language:   Normal Rate  Affect:  N/A  (telehealth)  Mood:  anxious and stressed  (has lessened some)  Thought process:  normal  Thought content:    WNL  Sensory/Perceptual disturbances:    WNL  Orientation:  oriented to person, place, time/date, situation, day of week, month of year and year  Attention:  Good  Concentration:  Good  Memory:  WNL  Fund of knowledge:   Good  Insight:    Good  Judgment:   Good  Impulse Control:  Fair   Risk Assessment: Danger to Self:  No Self-injurious Behavior: No Danger to Others:  No Duty to Warn:no Physical Aggression / Violence:No  Access to Firearms a concern: No  Gang Involvement:No   Subjective:   Patient, who has been sick more recently, did test negative, and allowed to be returning to her job. Today her mood is better.  She reports using strategies we discussed previously to help manage her anxiety and stress and both have lessened some recently.  Her faith is also important and helpful in managing symptoms. Has had some sleep inconsistency while she was sick and is still working on getting back into a healthier sleep pattern. Is better but also concerned about longer term holding on to gains made and achieving further improvements.  Is concerned about her recent weight gain, especially after having bariatric surgery previously.  Says that while she was sick recently, she gained more weight back as she was laying around most of the time, not able to exercise, and not always making healthy food choices. We discussed this in detai looking at what changes she knows she must make to be healthier, how to self-motivate more consistently, monitoring progress, exercising, making good food choices, getting back on better sleep schedule, how to counter negative/blaming self-talk, and also thinking back and identifying what helped previously in her journey of healing after bariatric surgery on 12/24/2017.     Reviewed strategies to help her better manage her anxiety and stress, change unhealthy habits into healthier habits, and re-frame situations that can help her feel more empowered and hopeful, and also take better  care of herself.  Reviewed list below:   *Plans to keep her list below in mind to help guide her choices: Deep breathing exercises Healthy eating choices Physical exercise daily/ walking/yoga/weights Get outside daily Pause and Pray (as this is important to patient) Good sleep habits Limit caffeine Meditation Reading inspirational books Journaling Music     Interventions: CBT and Solution-Focused  Diagnosis:   ICD-10-CM   1. Generalized anxiety disorder F41.1     Plan:  Patient to follow through on the strategies and behavioral changes noted above. To continue her journaling (which has helped her on prior issues) as mentioned above, and focus on self-care (physically, mentally, emotionally, and nutritionally). Goal review with patient (per goals including goals in Flowsheets section) and progress noted.  Next appt within 1-2 weeks.    Ana Fareeborah Buford Gayler, LCSW

## 2019-03-14 ENCOUNTER — Ambulatory Visit: Payer: BLUE CROSS/BLUE SHIELD | Admitting: Psychiatry

## 2019-03-14 ENCOUNTER — Other Ambulatory Visit: Payer: Self-pay

## 2019-03-14 ENCOUNTER — Ambulatory Visit (INDEPENDENT_AMBULATORY_CARE_PROVIDER_SITE_OTHER): Payer: BC Managed Care – PPO | Admitting: Psychiatry

## 2019-03-14 DIAGNOSIS — F411 Generalized anxiety disorder: Secondary | ICD-10-CM | POA: Diagnosis not present

## 2019-03-14 NOTE — Progress Notes (Signed)
      [facility] Counselor/Therapist Progress Note  Patient ID: Ana Walker, MRN: [id number],    Date: 03/14/2019  Time Spent: 63  Minutes 1:00pm to 2:00pm  Treatment Type: Individual Therapy   Reported Symptoms:  anxiety, stress, health concerns  Mental Status Exam:  Appearance:   casual  Behavior:  Sharing  Motor:  Normal    Speech/Language:   Normal Rate  Affect:  Anxious, stressed  Mood:  anxious and stressed  (has lessened some)  Thought process:  normal  Thought content:    WNL  Sensory/Perceptual disturbances:    WNL  Orientation:  oriented to person, place, time/date, situation, day of week, month of year and year  Attention:  Good  Concentration:  Good  Memory:  WNL  Fund of knowledge:   Good  Insight:    Good  Judgment:   Good  Impulse Control:  Fair   Risk Assessment: Danger to Self:  No Self-injurious Behavior: No Danger to Others: No Duty to Warn:no Physical Aggression / Violence:No  Access to Firearms a concern: No  Gang Involvement:No   Subjective:   Patient in today after having tested negative again today for covid-19.  Proudly shared she has lost 4 lbs. In 2 wks. Trying to get back on track to eating healthier.  Today her mood is some better.  She reports using strategies we discussed previously to help manage her anxiety and stress, as well as some depression.  Her faith remains very important and helpful to her in coping.  Sleep has improved and maintained the improvement. Is concerned about getting her physical health in better shape.  Has gotten out of her previous habit of exercising and swimming.  With the gyms shut down, that has affected her being able to keep up her exercising.  At this point, her mindset is that "my physical health is too important and I am figuring out a way to get some exercise equipment for me to have at home.  Encouraged her to consider walking as an alternative to gym activities right now, especially until she is  able to get some exercise equipment. Worked on motivation. Goal review and progress noted.    Reviewed list below:   *Plans to keep her list below in mind to help guide her choices: Deep breathing exercises Healthy eating choices Physical exercise daily/ walking/yoga/weights Get outside daily Pause and Pray (as this is important to patient) Good sleep habits Limit caffeine Meditation Reading inspirational books Journaling Music    Interventions: CBT and Solution-Focused  Diagnosis:   ICD-10-CM   1. Generalized anxiety disorder  F41.1     Plan:  Patient to follow through on the strategies and behavioral changes noted above. To continue her journaling (which has helped her on prior issues) as mentioned above, and focus on self-care (physically, mentally, emotionally, and nutritionally). Goal review with patient (per goals including goals in Flowsheets section) and progress noted.  Next appt within 1-2 weeks.    Shanon Ace, LCSW

## 2019-03-19 ENCOUNTER — Ambulatory Visit: Payer: BLUE CROSS/BLUE SHIELD | Admitting: Psychiatry

## 2019-03-19 DIAGNOSIS — F419 Anxiety disorder, unspecified: Secondary | ICD-10-CM | POA: Diagnosis not present

## 2019-03-19 DIAGNOSIS — Z6841 Body Mass Index (BMI) 40.0 and over, adult: Secondary | ICD-10-CM | POA: Diagnosis not present

## 2019-03-19 DIAGNOSIS — J309 Allergic rhinitis, unspecified: Secondary | ICD-10-CM | POA: Diagnosis not present

## 2019-03-25 ENCOUNTER — Ambulatory Visit: Payer: BLUE CROSS/BLUE SHIELD | Admitting: Psychiatry

## 2019-03-29 ENCOUNTER — Emergency Department (HOSPITAL_COMMUNITY): Payer: BC Managed Care – PPO

## 2019-03-29 ENCOUNTER — Emergency Department (HOSPITAL_COMMUNITY)
Admission: EM | Admit: 2019-03-29 | Discharge: 2019-03-29 | Disposition: A | Discharge status: Home / Self Care | Payer: BC Managed Care – PPO | Attending: Emergency Medicine | Admitting: Emergency Medicine

## 2019-03-29 ENCOUNTER — Other Ambulatory Visit: Payer: Self-pay

## 2019-03-29 DIAGNOSIS — Z79899 Other long term (current) drug therapy: Secondary | ICD-10-CM | POA: Insufficient documentation

## 2019-03-29 DIAGNOSIS — S40011A Contusion of right shoulder, initial encounter: Secondary | ICD-10-CM | POA: Diagnosis not present

## 2019-03-29 DIAGNOSIS — S0990XA Unspecified injury of head, initial encounter: Secondary | ICD-10-CM | POA: Diagnosis not present

## 2019-03-29 DIAGNOSIS — Y939 Activity, unspecified: Secondary | ICD-10-CM | POA: Insufficient documentation

## 2019-03-29 DIAGNOSIS — Y92410 Unspecified street and highway as the place of occurrence of the external cause: Secondary | ICD-10-CM | POA: Diagnosis not present

## 2019-03-29 DIAGNOSIS — Y999 Unspecified external cause status: Secondary | ICD-10-CM | POA: Diagnosis not present

## 2019-03-29 DIAGNOSIS — S199XXA Unspecified injury of neck, initial encounter: Secondary | ICD-10-CM | POA: Diagnosis not present

## 2019-03-29 DIAGNOSIS — S0083XA Contusion of other part of head, initial encounter: Secondary | ICD-10-CM | POA: Diagnosis not present

## 2019-03-29 DIAGNOSIS — S3991XA Unspecified injury of abdomen, initial encounter: Secondary | ICD-10-CM | POA: Diagnosis not present

## 2019-03-29 DIAGNOSIS — S9001XA Contusion of right ankle, initial encounter: Secondary | ICD-10-CM | POA: Diagnosis not present

## 2019-03-29 DIAGNOSIS — S20211A Contusion of right front wall of thorax, initial encounter: Secondary | ICD-10-CM | POA: Diagnosis not present

## 2019-03-29 DIAGNOSIS — S0993XA Unspecified injury of face, initial encounter: Secondary | ICD-10-CM | POA: Diagnosis not present

## 2019-03-29 DIAGNOSIS — D649 Anemia, unspecified: Secondary | ICD-10-CM | POA: Insufficient documentation

## 2019-03-29 DIAGNOSIS — S299XXA Unspecified injury of thorax, initial encounter: Secondary | ICD-10-CM | POA: Diagnosis not present

## 2019-03-29 DIAGNOSIS — S99911A Unspecified injury of right ankle, initial encounter: Secondary | ICD-10-CM | POA: Diagnosis not present

## 2019-03-29 DIAGNOSIS — S4991XA Unspecified injury of right shoulder and upper arm, initial encounter: Secondary | ICD-10-CM | POA: Diagnosis not present

## 2019-03-29 LAB — COMPREHENSIVE METABOLIC PANEL
ALT: 25 U/L (ref 0–44)
AST: 20 U/L (ref 15–41)
Albumin: 3.4 g/dL — ABNORMAL LOW (ref 3.5–5.0)
Alkaline Phosphatase: 58 U/L (ref 38–126)
Anion gap: 11 (ref 5–15)
BUN: 24 mg/dL — ABNORMAL HIGH (ref 6–20)
CO2: 23 mmol/L (ref 22–32)
Calcium: 8.8 mg/dL — ABNORMAL LOW (ref 8.9–10.3)
Chloride: 103 mmol/L (ref 98–111)
Creatinine, Ser: 0.69 mg/dL (ref 0.44–1.00)
GFR calc Af Amer: >60 mL/min (ref >60)
GFR calc non Af Amer: >60 mL/min (ref >60)
Glucose, Bld: 114 mg/dL — ABNORMAL HIGH (ref 70–99)
Potassium: 3.6 mmol/L (ref 3.5–5.1)
Sodium: 137 mmol/L (ref 135–145)
Total Bilirubin: 0.6 mg/dL (ref 0.3–1.2)
Total Protein: 7.1 g/dL (ref 6.5–8.1)

## 2019-03-29 LAB — CBC WITH DIFFERENTIAL/PLATELET
Abs Immature Granulocytes: 0.06 10*3/uL (ref 0.00–0.07)
Basophils Absolute: 0 10*3/uL (ref 0.0–0.1)
Basophils Relative: 0 %
Eosinophils Absolute: 0.1 10*3/uL (ref 0.0–0.5)
Eosinophils Relative: 1 %
HCT: 35.6 % — ABNORMAL LOW (ref 36.0–46.0)
Hemoglobin: 11.6 g/dL — ABNORMAL LOW (ref 12.0–15.0)
Immature Granulocytes: 1 %
Lymphocytes Relative: 13 %
Lymphs Abs: 1.6 10*3/uL (ref 0.7–4.0)
MCH: 29.7 pg (ref 26.0–34.0)
MCHC: 32.6 g/dL (ref 30.0–36.0)
MCV: 91 fL (ref 80.0–100.0)
Monocytes Absolute: 1.3 10*3/uL — ABNORMAL HIGH (ref 0.1–1.0)
Monocytes Relative: 11 %
Neutro Abs: 8.9 10*3/uL — ABNORMAL HIGH (ref 1.7–7.7)
Neutrophils Relative %: 74 %
Platelets: 217 10*3/uL (ref 150–400)
RBC: 3.91 MIL/uL (ref 3.87–5.11)
RDW: 13.6 % (ref 11.5–15.5)
WBC: 12 10*3/uL — ABNORMAL HIGH (ref 4.0–10.5)
nRBC: 0 % (ref 0.0–0.2)

## 2019-03-29 LAB — ETHANOL: Alcohol, Ethyl (B): <10 mg/dL (ref <10)

## 2019-03-29 LAB — I-STAT BETA HCG BLOOD, ED (MC, WL, AP ONLY): I-stat hCG, quantitative: <5 m[IU]/mL (ref <5)

## 2019-03-29 LAB — PROTIME-INR
INR: 1 (ref 0.8–1.2)
Prothrombin Time: 12.6 seconds (ref 11.4–15.2)

## 2019-03-29 LAB — SAMPLE TO BLOOD BANK

## 2019-03-29 MED ORDER — ONDANSETRON HCL 4 MG/2ML IJ SOLN
4.0000 mg | Freq: Once | INTRAMUSCULAR | Status: AC
  Administered 2019-03-29: 4 mg via INTRAVENOUS
  Filled 2019-03-29: qty 2

## 2019-03-29 MED ORDER — IOHEXOL 300 MG/ML  SOLN
125.0000 mL | Freq: Once | INTRAMUSCULAR | Status: AC | PRN
  Administered 2019-03-29: 125 mL via INTRAVENOUS

## 2019-03-29 MED ORDER — MORPHINE SULFATE (PF) 4 MG/ML IV SOLN
4.0000 mg | Freq: Once | INTRAVENOUS | Status: AC
  Administered 2019-03-29: 4 mg via INTRAVENOUS
  Filled 2019-03-29: qty 1

## 2019-03-29 NOTE — Discharge Instructions (Signed)
Apply ice for thirty minutes at a time, four times a day.  Take acetaminophen as needed for pain.  Activity as tolerated.
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
## 2019-03-30 ENCOUNTER — Encounter (HOSPITAL_COMMUNITY): Payer: Self-pay | Admitting: Emergency Medicine

## 2019-03-30 ENCOUNTER — Other Ambulatory Visit: Payer: Self-pay

## 2019-03-30 ENCOUNTER — Emergency Department (HOSPITAL_COMMUNITY)
Admission: EM | Admit: 2019-03-30 | Discharge: 2019-03-30 | Discharge status: Against Medical Advice | Payer: BC Managed Care – PPO | Attending: Emergency Medicine | Admitting: Emergency Medicine

## 2019-03-30 DIAGNOSIS — M7918 Myalgia, other site: Secondary | ICD-10-CM | POA: Insufficient documentation

## 2019-03-30 DIAGNOSIS — Z5321 Procedure and treatment not carried out due to patient leaving prior to being seen by health care provider: Secondary | ICD-10-CM | POA: Insufficient documentation
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
## 2019-03-31 ENCOUNTER — Emergency Department (HOSPITAL_COMMUNITY)
Admission: EM | Admit: 2019-03-31 | Discharge: 2019-03-31 | Disposition: A | Discharge status: Home / Self Care | Payer: BC Managed Care – PPO | Attending: Emergency Medicine | Admitting: Emergency Medicine

## 2019-03-31 ENCOUNTER — Encounter (HOSPITAL_COMMUNITY): Payer: Self-pay | Admitting: Emergency Medicine

## 2019-03-31 ENCOUNTER — Emergency Department (HOSPITAL_COMMUNITY): Payer: BC Managed Care – PPO

## 2019-03-31 ENCOUNTER — Other Ambulatory Visit: Payer: Self-pay

## 2019-03-31 DIAGNOSIS — S7002XA Contusion of left hip, initial encounter: Secondary | ICD-10-CM | POA: Diagnosis not present

## 2019-03-31 DIAGNOSIS — M7918 Myalgia, other site: Secondary | ICD-10-CM | POA: Insufficient documentation

## 2019-03-31 DIAGNOSIS — S6992XA Unspecified injury of left wrist, hand and finger(s), initial encounter: Secondary | ICD-10-CM | POA: Diagnosis not present

## 2019-03-31 DIAGNOSIS — R42 Dizziness and giddiness: Secondary | ICD-10-CM | POA: Insufficient documentation

## 2019-03-31 DIAGNOSIS — S301XXA Contusion of abdominal wall, initial encounter: Secondary | ICD-10-CM | POA: Diagnosis not present

## 2019-03-31 DIAGNOSIS — R52 Pain, unspecified: Secondary | ICD-10-CM | POA: Diagnosis not present

## 2019-03-31 DIAGNOSIS — S2001XA Contusion of right breast, initial encounter: Secondary | ICD-10-CM | POA: Diagnosis not present

## 2019-03-31 MED ORDER — CYCLOBENZAPRINE HCL 10 MG PO TABS: 10.0000 mg | ORAL_TABLET | Freq: Two times a day (BID) | ORAL | 0 refills | Status: DC | PRN

## 2019-03-31 MED ORDER — OXYCODONE-ACETAMINOPHEN 5-325 MG PO TABS
1.0000 | ORAL_TABLET | Freq: Once | ORAL | Status: AC
  Administered 2019-03-31: 1 via ORAL
  Filled 2019-03-31: qty 1

## 2019-03-31 NOTE — ED Notes (Signed)
Patient verbalizes understanding of discharge instructions. Opportunity for questioning and answers were provided. Armband removed by staff, pt discharged from ED.  
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
## 2019-03-31 NOTE — Discharge Instructions (Signed)
Please read attached information. If you experience any new or worsening signs or symptoms please return to the emergency room for evaluation. Please follow-up with your primary care provider or specialist as discussed. Please use medication prescribed only as directed and discontinue taking if you have any concerning signs or symptoms.   °

## 2019-04-04 ENCOUNTER — Other Ambulatory Visit: Payer: Self-pay

## 2019-04-04 ENCOUNTER — Ambulatory Visit (INDEPENDENT_AMBULATORY_CARE_PROVIDER_SITE_OTHER): Payer: BC Managed Care – PPO | Admitting: Psychiatry

## 2019-04-04 DIAGNOSIS — F411 Generalized anxiety disorder: Secondary | ICD-10-CM | POA: Diagnosis not present
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
## 2019-04-10 DIAGNOSIS — Z6841 Body Mass Index (BMI) 40.0 and over, adult: Secondary | ICD-10-CM | POA: Diagnosis not present

## 2019-04-10 DIAGNOSIS — N644 Mastodynia: Secondary | ICD-10-CM | POA: Diagnosis not present

## 2019-04-10 DIAGNOSIS — Z1331 Encounter for screening for depression: Secondary | ICD-10-CM | POA: Diagnosis not present

## 2019-04-10 DIAGNOSIS — M25511 Pain in right shoulder: Secondary | ICD-10-CM | POA: Diagnosis not present

## 2019-04-11 DIAGNOSIS — M25511 Pain in right shoulder: Secondary | ICD-10-CM | POA: Diagnosis not present

## 2019-04-15 ENCOUNTER — Other Ambulatory Visit: Payer: Self-pay | Admitting: Orthopedic Surgery

## 2019-04-15 DIAGNOSIS — M25511 Pain in right shoulder: Secondary | ICD-10-CM

## 2019-04-16 DIAGNOSIS — S2001XA Contusion of right breast, initial encounter: Secondary | ICD-10-CM | POA: Diagnosis not present

## 2019-04-16 DIAGNOSIS — X58XXXA Exposure to other specified factors, initial encounter: Secondary | ICD-10-CM | POA: Diagnosis not present

## 2019-04-16 DIAGNOSIS — N6001 Solitary cyst of right breast: Secondary | ICD-10-CM | POA: Diagnosis not present

## 2019-04-16 DIAGNOSIS — N644 Mastodynia: Secondary | ICD-10-CM | POA: Diagnosis not present

## 2019-04-16 DIAGNOSIS — R928 Other abnormal and inconclusive findings on diagnostic imaging of breast: Secondary | ICD-10-CM | POA: Diagnosis not present

## 2019-04-22 ENCOUNTER — Other Ambulatory Visit: Payer: Self-pay

## 2019-04-22 ENCOUNTER — Ambulatory Visit
Admission: RE | Admit: 2019-04-22 | Discharge: 2019-04-22 | Disposition: A | Discharge status: Home / Self Care | Payer: BC Managed Care – PPO | Source: Ambulatory Visit | Attending: Orthopedic Surgery | Admitting: Orthopedic Surgery

## 2019-04-22 ENCOUNTER — Ambulatory Visit (INDEPENDENT_AMBULATORY_CARE_PROVIDER_SITE_OTHER): Payer: BC Managed Care – PPO | Admitting: Psychiatry

## 2019-04-22 DIAGNOSIS — F411 Generalized anxiety disorder: Secondary | ICD-10-CM | POA: Diagnosis not present

## 2019-04-22 DIAGNOSIS — M25511 Pain in right shoulder: Secondary | ICD-10-CM

## 2019-04-22 DIAGNOSIS — S46011A Strain of muscle(s) and tendon(s) of the rotator cuff of right shoulder, initial encounter: Secondary | ICD-10-CM | POA: Diagnosis not present
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
## 2019-04-23 DIAGNOSIS — G4733 Obstructive sleep apnea (adult) (pediatric): Secondary | ICD-10-CM | POA: Diagnosis not present

## 2019-04-25 DIAGNOSIS — M25511 Pain in right shoulder: Secondary | ICD-10-CM | POA: Diagnosis not present

## 2019-04-29 ENCOUNTER — Ambulatory Visit: Payer: BC Managed Care – PPO | Admitting: Psychiatry

## 2019-04-30 DIAGNOSIS — F419 Anxiety disorder, unspecified: Secondary | ICD-10-CM | POA: Diagnosis not present

## 2019-04-30 DIAGNOSIS — G4700 Insomnia, unspecified: Secondary | ICD-10-CM | POA: Diagnosis not present

## 2019-04-30 DIAGNOSIS — R42 Dizziness and giddiness: Secondary | ICD-10-CM | POA: Diagnosis not present

## 2019-04-30 DIAGNOSIS — Z6841 Body Mass Index (BMI) 40.0 and over, adult: Secondary | ICD-10-CM | POA: Diagnosis not present

## 2019-05-06 ENCOUNTER — Ambulatory Visit (INDEPENDENT_AMBULATORY_CARE_PROVIDER_SITE_OTHER): Payer: BC Managed Care – PPO | Admitting: Psychiatry

## 2019-05-06 ENCOUNTER — Other Ambulatory Visit: Payer: Self-pay

## 2019-05-06 DIAGNOSIS — F411 Generalized anxiety disorder: Secondary | ICD-10-CM | POA: Diagnosis not present
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
## 2019-05-12 ENCOUNTER — Encounter: Payer: Self-pay | Admitting: *Deleted

## 2019-05-12 ENCOUNTER — Other Ambulatory Visit: Payer: Self-pay | Admitting: *Deleted

## 2019-05-13 ENCOUNTER — Encounter: Payer: Self-pay | Admitting: Diagnostic Neuroimaging

## 2019-05-13 ENCOUNTER — Ambulatory Visit (INDEPENDENT_AMBULATORY_CARE_PROVIDER_SITE_OTHER): Payer: BC Managed Care – PPO | Admitting: Diagnostic Neuroimaging

## 2019-05-13 ENCOUNTER — Ambulatory Visit (INDEPENDENT_AMBULATORY_CARE_PROVIDER_SITE_OTHER): Payer: BC Managed Care – PPO | Admitting: Psychiatry

## 2019-05-13 ENCOUNTER — Other Ambulatory Visit: Payer: Self-pay

## 2019-05-13 VITALS — BP 116/70 | HR 68 | Temp 98.4°F | Ht 63.0 in | Wt 295.8 lb

## 2019-05-13 DIAGNOSIS — F0781 Postconcussional syndrome: Secondary | ICD-10-CM

## 2019-05-13 DIAGNOSIS — G4709 Other insomnia: Secondary | ICD-10-CM | POA: Diagnosis not present

## 2019-05-13 DIAGNOSIS — F411 Generalized anxiety disorder: Secondary | ICD-10-CM | POA: Diagnosis not present
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
## 2019-05-13 NOTE — Patient Instructions (Addendum)
POST CONCUSSION SYNDROME - optimize nutrition, exercise, activities - hopefully will gradually recover over time (next few months) - review [email or web address]
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
## 2019-05-20 ENCOUNTER — Other Ambulatory Visit: Payer: Self-pay

## 2019-05-20 ENCOUNTER — Ambulatory Visit (INDEPENDENT_AMBULATORY_CARE_PROVIDER_SITE_OTHER): Payer: BC Managed Care – PPO | Admitting: Psychiatry

## 2019-05-20 DIAGNOSIS — F4310 Post-traumatic stress disorder, unspecified: Secondary | ICD-10-CM

## 2019-05-20 DIAGNOSIS — F411 Generalized anxiety disorder: Secondary | ICD-10-CM | POA: Insufficient documentation
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
## 2019-05-22 ENCOUNTER — Ambulatory Visit: Payer: Self-pay | Admitting: Physician Assistant
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
## 2019-05-22 NOTE — Pre-Procedure Instructions (Signed)
[place] [address], [place] [address] Phone: [phone] Fax: [phone]      Your procedure is scheduled on 05-27-19  Report to [facility] Main Entrance "A" at 0530A.M., and check in at the Admitting office.  Call this number if you have problems the morning of surgery:  [phone]  Call [phone] if you have any questions prior to your surgery date Monday-Friday 8am-4pm    Remember:  Do not eat or drink after midnight the night before your surgery  You may drink clear liquids until 0430 AM the morning of your surgery.   Clear liquids allowed are: Water, Non-Citrus Juices (without pulp), Carbonated Beverages, Clear Tea, Black Coffee Only, and Gatorade  Please complete your PRE-SURGERY ENSURE that was provided to you by 0430AM the morning of surgery.  Please, if able, drink it in one setting. DO NOT SIP.    Take these medicines the morning of surgery with A SIP OF WATER: acetaminophen (TYLENOL)as needed ALPRAZolam (XANAX) FLUoxetine (PROZAC) fluticasone (FLONASE) montelukast (SINGULAIR) oxyCODONE-acetaminophen (PERCOCET/ROXICET)  7 days prior to surgery STOP taking any Aspirin (unless otherwise instructed by your surgeon), Aleve, Naproxen, Ibuprofen, Motrin, Advil, Goody's, BC's, all herbal medications, fish oil, and all vitamins.    The Morning of Surgery  Do not wear jewelry, make-up or nail polish.  Do not wear lotions, powders, or perfumes/colognes, or deodorant  Do not shave 48 hours prior to surgery.    Do not bring valuables to the hospital.  [facility] is not responsible for any belongings or valuables.  If you are a smoker, DO NOT Smoke 24 hours prior to surgery IF you wear a CPAP at night please bring your mask, tubing, and machine the morning of surgery   Remember that you must have someone to transport you home after your surgery, and remain with you for 24 hours if you are discharged the same  day.   Contacts, glasses, hearing aids, dentures or bridgework may not be worn into surgery.    Leave your suitcase in the car.  After surgery it may be brought to your room.  For patients admitted to the hospital, discharge time will be determined by your treatment team.  Patients discharged the day of surgery will not be allowed to drive home.    Special instructions:   [facility]- Preparing For Surgery  Before surgery, you can play an important role. Because skin is not sterile, your skin needs to be as free of germs as possible. You can reduce the number of germs on your skin by washing with CHG (chlorahexidine gluconate) Soap before surgery.  CHG is an antiseptic cleaner which kills germs and bonds with the skin to continue killing germs even after washing.    Oral Hygiene is also important to reduce your risk of infection.  Remember - BRUSH YOUR TEETH THE MORNING OF SURGERY WITH YOUR REGULAR TOOTHPASTE  Please do not use if you have an allergy to CHG or antibacterial soaps. If your skin becomes reddened/irritated stop using the CHG.  Do not shave (including legs and underarms) for at least 48 hours prior to first CHG shower. It is OK to shave your face.  Please follow these instructions carefully.   1. Shower the NIGHT BEFORE SURGERY and the MORNING OF SURGERY with CHG Soap.   2. If you chose to wash your hair, wash your hair first as usual with your normal shampoo.  3. After  you shampoo, rinse your hair and body thoroughly to remove the shampoo.  4. Use CHG as you would any other liquid soap. You can apply CHG directly to the skin and wash gently with a scrungie or a clean washcloth.   5. Apply the CHG Soap to your body ONLY FROM THE NECK DOWN.  Do not use on open wounds or open sores. Avoid contact with your eyes, ears, mouth and genitals (private parts). Wash Face and genitals (private parts)  with your normal soap.   6. Wash thoroughly, paying special attention to the  area where your surgery will be performed.  7. Thoroughly rinse your body with warm water from the neck down.  8. DO NOT shower/wash with your normal soap after using and rinsing off the CHG Soap.  9. Pat yourself dry with a CLEAN TOWEL.  10. Wear CLEAN PAJAMAS to bed the night before surgery, wear comfortable clothes the morning of surgery  11. Place CLEAN SHEETS on your bed the night of your first shower and DO NOT SLEEP WITH PETS.  Day of Surgery:  Do not apply any deodorants/lotions. Please shower the morning of surgery with the CHG soap  Please wear clean clothes to the hospital/surgery center.   Remember to brush your teeth WITH YOUR REGULAR TOOTHPASTE.  Please read over the  fact sheets that you were given.
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
## 2019-05-23 ENCOUNTER — Encounter (HOSPITAL_COMMUNITY)
Admission: RE | Admit: 2019-05-23 | Discharge: 2019-05-23 | Disposition: A | Discharge status: Home / Self Care | Payer: BC Managed Care – PPO | Source: Ambulatory Visit | Attending: Orthopedic Surgery | Admitting: Orthopedic Surgery

## 2019-05-23 ENCOUNTER — Other Ambulatory Visit (HOSPITAL_COMMUNITY)
Admission: RE | Admit: 2019-05-23 | Discharge: 2019-05-23 | Disposition: A | Discharge status: Home / Self Care | Payer: BC Managed Care – PPO | Source: Ambulatory Visit | Attending: Orthopedic Surgery | Admitting: Orthopedic Surgery

## 2019-05-23 ENCOUNTER — Other Ambulatory Visit: Payer: Self-pay

## 2019-05-23 ENCOUNTER — Encounter (HOSPITAL_COMMUNITY): Payer: Self-pay

## 2019-05-23 DIAGNOSIS — Z20828 Contact with and (suspected) exposure to other viral communicable diseases: Secondary | ICD-10-CM | POA: Diagnosis not present

## 2019-05-23 DIAGNOSIS — Z01812 Encounter for preprocedural laboratory examination: Secondary | ICD-10-CM | POA: Insufficient documentation

## 2019-05-23 DIAGNOSIS — G4733 Obstructive sleep apnea (adult) (pediatric): Secondary | ICD-10-CM | POA: Diagnosis not present

## 2019-05-23 LAB — CBC
HCT: 39 % (ref 36.0–46.0)
Hemoglobin: 12.6 g/dL (ref 12.0–15.0)
MCH: 30.1 pg (ref 26.0–34.0)
MCHC: 32.3 g/dL (ref 30.0–36.0)
MCV: 93.1 fL (ref 80.0–100.0)
Platelets: 256 10*3/uL (ref 150–400)
RBC: 4.19 MIL/uL (ref 3.87–5.11)
RDW: 12.8 % (ref 11.5–15.5)
WBC: 8.8 10*3/uL (ref 4.0–10.5)
nRBC: 0 % (ref 0.0–0.2)

## 2019-05-23 LAB — SURGICAL PCR SCREEN
MRSA, PCR: POSITIVE — AB
Staphylococcus aureus: POSITIVE — AB

## 2019-05-23 LAB — SARS CORONAVIRUS 2 (TAT 6-24 HRS): SARS Coronavirus 2: NEGATIVE

## 2019-05-23 NOTE — Progress Notes (Signed)
Pt positive for MRSA,MSSA, Rx called into pharmacy. VM left notifying patient of the results and instructions for the antibiotic use.

## 2019-05-23 NOTE — Progress Notes (Signed)
   Coronavirus Screening Scheduled for COVID test today. Have you experienced the following symptoms:  Cough yes/no: No Fever (>100.45F)  yes/no: No Runny nose yes/no: No Sore throat yes/no: No Difficulty breathing/shortness of breath  yes/no: No Loss of smell or taste-no Have you or a family member traveled in the last 14 days and where? yes/no: No  PCP - Dr. Beckey Downing  Cardiologist - denies  Psych-Dr Rinaldo Cloud  Chest x-ray - NA  EKG - NA  Stress Test - denies  ECHO - records requested  Cardiac Cath - NA  AICD-denies PM- LOOP-  Sleep Study -  CPAP -   LABS-  ASA-  ERAS-  HA1C- Fasting Blood Sugar -  Checks Blood Sugar _____ times a day  Anesthesia-  Pt denies having chest pain, sob, or fever at this time. All instructions explained to the pt, with a verbal understanding of the material. Pt agrees to go over the instructions while at home for a better understanding. Pt also instructed to self quarantine after being tested for COVID-19. The opportunity to ask questions was provided.

## 2019-05-23 NOTE — Progress Notes (Signed)
  Coronavirus Screening  Have you experienced the following symptoms:  Cough yes/no: No Fever (>100.92F)  yes/no: No Runny nose yes/no: No Sore throat yes/no: No Difficulty breathing/shortness of breath  yes/no: No  Have you or a family member traveled in the last 14 days and where? yes/no: No  PCP -   Cardiologist -   Chest x-ray -   EKG -   Stress Test -   ECHO -   Cardiac Cath -   AICD- PM- LOOP-  Sleep Study -  CPAP -   LABS-  ASA-  ERAS-  HA1C- Fasting Blood Sugar -  Checks Blood Sugar _____ times a day  Anesthesia-  Pt denies having chest pain, sob, or fever at this time. All instructions explained to the pt, with a verbal understanding of the material. Pt agrees to go over the instructions while at home for a better understanding. Pt also instructed to self quarantine after being tested for COVID-19. The opportunity to ask questions was provided.

## 2019-05-26 MED ORDER — DEXTROSE 5 % IV SOLN
3.0000 g | INTRAVENOUS | Status: AC
  Administered 2019-05-27: 3 g via INTRAVENOUS
  Filled 2019-05-26: qty 3000
  Filled 2019-05-26: qty 3

## 2019-05-26 NOTE — Anesthesia Preprocedure Evaluation (Addendum)
Anesthesia Evaluation  Patient identified by MRN, date of birth, ID band Patient awake    Reviewed: Allergy & Precautions, NPO status , Patient's Chart, lab work & pertinent test results  History of Anesthesia Complications Negative for: history of anesthetic complications  Airway Mallampati: II  TM Distance: >3 FB Neck ROM: Full    Dental no notable dental hx.    Pulmonary sleep apnea and Continuous Positive Airway Pressure Ventilation ,    Pulmonary exam normal        Cardiovascular negative cardio ROS Normal cardiovascular exam     Neuro/Psych Anxiety negative neurological ROS     GI/Hepatic negative GI ROS, Neg liver ROS,   Endo/Other  Morbid obesity  Renal/GU negative Renal ROS     Musculoskeletal negative musculoskeletal ROS (+)   Abdominal   Peds  Hematology negative hematology ROS (+)   Anesthesia Other Findings Day of surgery medications reviewed with the patient.  Reproductive/Obstetrics                            Anesthesia Physical Anesthesia Plan  ASA: III  Anesthesia Plan: General   Post-op Pain Management: GA combined w/ Regional for post-op pain   Induction: Intravenous  PONV Risk Score and Plan: 3 and Treatment may vary due to age or medical condition, Ondansetron, Dexamethasone and Midazolam  Airway Management Planned: Oral ETT  Additional Equipment: None  Intra-op Plan:   Post-operative Plan: Extubation in OR  Informed Consent: I have reviewed the patients History and Physical, chart, labs and discussed the procedure including the risks, benefits and alternatives for the proposed anesthesia with the patient or authorized representative who has indicated his/her understanding and acceptance.     Dental advisory given  Plan Discussed with: CRNA  Anesthesia Plan Comments:        Anesthesia Quick Evaluation

## 2019-05-27 ENCOUNTER — Encounter (HOSPITAL_COMMUNITY)
Admission: RE | Disposition: A | Discharge status: Home / Self Care | Payer: Self-pay | Source: Home / Self Care | Attending: Orthopedic Surgery

## 2019-05-27 ENCOUNTER — Other Ambulatory Visit: Payer: Self-pay

## 2019-05-27 ENCOUNTER — Encounter (HOSPITAL_COMMUNITY): Payer: Self-pay | Admitting: General Practice

## 2019-05-27 ENCOUNTER — Ambulatory Visit (HOSPITAL_COMMUNITY): Payer: BC Managed Care – PPO | Admitting: Anesthesiology

## 2019-05-27 ENCOUNTER — Ambulatory Visit (HOSPITAL_COMMUNITY)
Admission: RE | Admit: 2019-05-27 | Discharge: 2019-05-27 | Disposition: A | Discharge status: Home / Self Care | Payer: BC Managed Care – PPO | Attending: Orthopedic Surgery | Admitting: Orthopedic Surgery

## 2019-05-27 ENCOUNTER — Ambulatory Visit (HOSPITAL_COMMUNITY): Payer: BC Managed Care – PPO | Admitting: Vascular Surgery

## 2019-05-27 DIAGNOSIS — Z9884 Bariatric surgery status: Secondary | ICD-10-CM | POA: Diagnosis not present

## 2019-05-27 DIAGNOSIS — Z6841 Body Mass Index (BMI) 40.0 and over, adult: Secondary | ICD-10-CM | POA: Insufficient documentation

## 2019-05-27 DIAGNOSIS — M25811 Other specified joint disorders, right shoulder: Secondary | ICD-10-CM | POA: Insufficient documentation

## 2019-05-27 DIAGNOSIS — G8918 Other acute postprocedural pain: Secondary | ICD-10-CM | POA: Diagnosis not present

## 2019-05-27 DIAGNOSIS — G4730 Sleep apnea, unspecified: Secondary | ICD-10-CM | POA: Insufficient documentation

## 2019-05-27 DIAGNOSIS — M24111 Other articular cartilage disorders, right shoulder: Secondary | ICD-10-CM | POA: Diagnosis not present

## 2019-05-27 DIAGNOSIS — F419 Anxiety disorder, unspecified: Secondary | ICD-10-CM | POA: Diagnosis not present

## 2019-05-27 DIAGNOSIS — M19111 Post-traumatic osteoarthritis, right shoulder: Secondary | ICD-10-CM | POA: Diagnosis not present

## 2019-05-27 DIAGNOSIS — M25511 Pain in right shoulder: Secondary | ICD-10-CM | POA: Diagnosis not present

## 2019-05-27 DIAGNOSIS — M19011 Primary osteoarthritis, right shoulder: Secondary | ICD-10-CM | POA: Diagnosis not present

## 2019-05-27 DIAGNOSIS — M7551 Bursitis of right shoulder: Secondary | ICD-10-CM | POA: Diagnosis not present

## 2019-05-27 DIAGNOSIS — M75110 Incomplete rotator cuff tear or rupture of unspecified shoulder, not specified as traumatic: Secondary | ICD-10-CM | POA: Diagnosis not present

## 2019-05-27 DIAGNOSIS — M7541 Impingement syndrome of right shoulder: Secondary | ICD-10-CM | POA: Diagnosis not present

## 2019-05-27 DIAGNOSIS — M75111 Incomplete rotator cuff tear or rupture of right shoulder, not specified as traumatic: Secondary | ICD-10-CM | POA: Diagnosis not present

## 2019-05-27 HISTORY — PX: SHOULDER ARTHROSCOPY WITH OPEN ROTATOR CUFF REPAIR AND DISTAL CLAVICLE ACROMINECTOMY: SHX5683

## 2019-05-27 LAB — POCT PREGNANCY, URINE: Preg Test, Ur: NEGATIVE

## 2019-05-27 SURGERY — SHOULDER ARTHROSCOPY WITH OPEN ROTATOR CUFF REPAIR AND DISTAL CLAVICLE ACROMINECTOMY
Anesthesia: General | Site: Shoulder | Laterality: Right

## 2019-05-27 MED ORDER — ACETAMINOPHEN 500 MG PO TABS
1000.0000 mg | ORAL_TABLET | Freq: Once | ORAL | Status: DC
  Filled 2019-05-27: qty 2

## 2019-05-27 MED ORDER — SODIUM CHLORIDE 0.9 % IV SOLN: INTRAVENOUS | Status: DC

## 2019-05-27 MED ORDER — OXYCODONE HCL 5 MG PO TABS: ORAL_TABLET | ORAL | 0 refills | Status: DC

## 2019-05-27 MED ORDER — PROMETHAZINE HCL 25 MG/ML IJ SOLN: 6.2500 mg | INTRAMUSCULAR | Status: DC | PRN

## 2019-05-27 MED ORDER — 0.9 % SODIUM CHLORIDE (POUR BTL) OPTIME
TOPICAL | Status: DC | PRN
  Administered 2019-05-27: 1000 mL

## 2019-05-27 MED ORDER — PROPOFOL 10 MG/ML IV BOLUS
INTRAVENOUS | Status: DC | PRN
  Administered 2019-05-27: 50 mg via INTRAVENOUS
  Administered 2019-05-27: 200 mg via INTRAVENOUS

## 2019-05-27 MED ORDER — METHYLPREDNISOLONE ACETATE 80 MG/ML IJ SUSP
INTRAMUSCULAR | Status: AC
  Filled 2019-05-27: qty 1

## 2019-05-27 MED ORDER — BUPIVACAINE LIPOSOME 1.3 % IJ SUSP
INTRAMUSCULAR | Status: DC | PRN
  Administered 2019-05-27: 10 mL via PERINEURAL

## 2019-05-27 MED ORDER — FENTANYL CITRATE (PF) 250 MCG/5ML IJ SOLN
INTRAMUSCULAR | Status: DC | PRN
  Administered 2019-05-27 (×3): 50 ug via INTRAVENOUS

## 2019-05-27 MED ORDER — OXYCODONE HCL 5 MG PO TABS: 5.0000 mg | ORAL_TABLET | Freq: Once | ORAL | Status: DC | PRN

## 2019-05-27 MED ORDER — BUPIVACAINE-EPINEPHRINE (PF) 0.5% -1:200000 IJ SOLN
INTRAMUSCULAR | Status: DC | PRN
  Administered 2019-05-27: 15 mL via PERINEURAL

## 2019-05-27 MED ORDER — VANCOMYCIN HCL 10 G IV SOLR
1500.0000 mg | INTRAVENOUS | Status: AC
  Administered 2019-05-27: 07:00:00 1500 mg via INTRAVENOUS
  Filled 2019-05-27: qty 1500

## 2019-05-27 MED ORDER — PROPOFOL 10 MG/ML IV BOLUS
INTRAVENOUS | Status: AC
  Filled 2019-05-27: qty 20

## 2019-05-27 MED ORDER — SUCCINYLCHOLINE CHLORIDE 200 MG/10ML IV SOSY
PREFILLED_SYRINGE | INTRAVENOUS | Status: DC | PRN
  Administered 2019-05-27: 160 mg via INTRAVENOUS

## 2019-05-27 MED ORDER — ONDANSETRON HCL 4 MG/2ML IJ SOLN
INTRAMUSCULAR | Status: DC | PRN
  Administered 2019-05-27: 4 mg via INTRAVENOUS

## 2019-05-27 MED ORDER — LIDOCAINE 2% (20 MG/ML) 5 ML SYRINGE
INTRAMUSCULAR | Status: AC
  Filled 2019-05-27: qty 5

## 2019-05-27 MED ORDER — ACETAMINOPHEN 500 MG PO TABS
1000.0000 mg | ORAL_TABLET | Freq: Three times a day (TID) | ORAL | Status: DC | PRN
  Administered 2019-05-27: 1000 mg via ORAL

## 2019-05-27 MED ORDER — SODIUM CHLORIDE 0.9 % IV SOLN
INTRAVENOUS | Status: DC | PRN
  Administered 2019-05-27: 40 ug/min via INTRAVENOUS

## 2019-05-27 MED ORDER — ROCURONIUM BROMIDE 50 MG/5ML IV SOSY: PREFILLED_SYRINGE | INTRAVENOUS | Status: DC | PRN

## 2019-05-27 MED ORDER — DEXAMETHASONE SODIUM PHOSPHATE 10 MG/ML IJ SOLN
INTRAMUSCULAR | Status: AC
  Filled 2019-05-27: qty 1

## 2019-05-27 MED ORDER — MIDAZOLAM HCL 2 MG/2ML IJ SOLN
INTRAMUSCULAR | Status: AC
  Filled 2019-05-27: qty 2

## 2019-05-27 MED ORDER — PHENYLEPHRINE 40 MCG/ML (10ML) SYRINGE FOR IV PUSH (FOR BLOOD PRESSURE SUPPORT)
PREFILLED_SYRINGE | INTRAVENOUS | Status: DC | PRN
  Administered 2019-05-27: 80 ug via INTRAVENOUS

## 2019-05-27 MED ORDER — EPHEDRINE 5 MG/ML INJ
INTRAVENOUS | Status: AC
  Filled 2019-05-27: qty 10

## 2019-05-27 MED ORDER — ROCURONIUM BROMIDE 10 MG/ML (PF) SYRINGE
PREFILLED_SYRINGE | INTRAVENOUS | Status: AC
  Filled 2019-05-27: qty 10

## 2019-05-27 MED ORDER — LIDOCAINE 2% (20 MG/ML) 5 ML SYRINGE
INTRAMUSCULAR | Status: DC | PRN
  Administered 2019-05-27: 40 mg via INTRAVENOUS
  Administered 2019-05-27: 60 mg via INTRAVENOUS

## 2019-05-27 MED ORDER — EPHEDRINE SULFATE-NACL 50-0.9 MG/10ML-% IV SOSY
PREFILLED_SYRINGE | INTRAVENOUS | Status: DC | PRN
  Administered 2019-05-27: 10 mg via INTRAVENOUS

## 2019-05-27 MED ORDER — ACETAMINOPHEN 500 MG PO TABS
ORAL_TABLET | ORAL | Status: AC
  Filled 2019-05-27: qty 2

## 2019-05-27 MED ORDER — FENTANYL CITRATE (PF) 250 MCG/5ML IJ SOLN
INTRAMUSCULAR | Status: AC
  Filled 2019-05-27: qty 5

## 2019-05-27 MED ORDER — MIDAZOLAM HCL 5 MG/5ML IJ SOLN
INTRAMUSCULAR | Status: DC | PRN
  Administered 2019-05-27: 2 mg via INTRAVENOUS

## 2019-05-27 MED ORDER — ROCURONIUM BROMIDE 10 MG/ML (PF) SYRINGE
PREFILLED_SYRINGE | INTRAVENOUS | Status: DC | PRN
  Administered 2019-05-27: 10 mg via INTRAVENOUS
  Administered 2019-05-27: 40 mg via INTRAVENOUS

## 2019-05-27 MED ORDER — FENTANYL CITRATE (PF) 100 MCG/2ML IJ SOLN: 25.0000 ug | INTRAMUSCULAR | Status: DC | PRN

## 2019-05-27 MED ORDER — BUPIVACAINE-EPINEPHRINE (PF) 0.5% -1:200000 IJ SOLN
INTRAMUSCULAR | Status: AC
  Filled 2019-05-27: qty 30

## 2019-05-27 MED ORDER — SODIUM CHLORIDE 0.9 % IR SOLN
Status: DC | PRN
  Administered 2019-05-27 (×3): 3000 mL

## 2019-05-27 MED ORDER — LACTATED RINGERS IV SOLN
INTRAVENOUS | Status: DC | PRN
  Administered 2019-05-27: 07:00:00 via INTRAVENOUS

## 2019-05-27 MED ORDER — CHLORHEXIDINE GLUCONATE 4 % EX LIQD: 60.0000 mL | Freq: Once | CUTANEOUS | Status: DC

## 2019-05-27 MED ORDER — DEXAMETHASONE SODIUM PHOSPHATE 10 MG/ML IJ SOLN
INTRAMUSCULAR | Status: DC | PRN
  Administered 2019-05-27: 10 mg via INTRAVENOUS

## 2019-05-27 MED ORDER — OXYCODONE HCL 5 MG/5ML PO SOLN: 5.0000 mg | Freq: Once | ORAL | Status: DC | PRN

## 2019-05-27 MED ORDER — ONDANSETRON HCL 4 MG/2ML IJ SOLN
INTRAMUSCULAR | Status: AC
  Filled 2019-05-27: qty 2

## 2019-05-27 MED ORDER — METHOCARBAMOL 500 MG PO TABS: 500.0000 mg | ORAL_TABLET | Freq: Four times a day (QID) | ORAL | 0 refills | Status: DC | PRN

## 2019-05-27 MED ORDER — SUGAMMADEX SODIUM 500 MG/5ML IV SOLN
INTRAVENOUS | Status: DC | PRN
  Administered 2019-05-27: 300 mg via INTRAVENOUS

## 2019-05-27 SURGICAL SUPPLY — 64 items
APL SKNCLS STERI-STRIP NONHPOA (GAUZE/BANDAGES/DRESSINGS) ×1
BENZOIN TINCTURE PRP APPL 2/3 (GAUZE/BANDAGES/DRESSINGS) ×2 IMPLANT
BLADE GREAT WHITE 4.2 (BLADE) ×2 IMPLANT
BLADE LONG MED 31X9 (MISCELLANEOUS) IMPLANT
BLADE SURG 11 STRL SS (BLADE) ×3 IMPLANT
BUR OVAL 6.0 (BURR) ×2 IMPLANT
CANNULA SHOULDER 7CM (CANNULA) ×3 IMPLANT
CANNULA TWIST IN 8.25X7CM (CANNULA) ×2 IMPLANT
COVER SURGICAL LIGHT HANDLE (MISCELLANEOUS) ×2 IMPLANT
COVER WAND RF STERILE (DRAPES) ×2 IMPLANT
DRAPE STERI 35X30 U-POUCH (DRAPES) ×2 IMPLANT
DRAPE SURG 17X23 STRL (DRAPES) ×2 IMPLANT
DRAPE U-SHAPE 47X51 STRL (DRAPES) ×2 IMPLANT
DRSG ADAPTIC 3X8 NADH LF (GAUZE/BANDAGES/DRESSINGS) ×2 IMPLANT
DRSG PAD ABDOMINAL 8X10 ST (GAUZE/BANDAGES/DRESSINGS) ×2 IMPLANT
DURAPREP 26ML APPLICATOR (WOUND CARE) ×3 IMPLANT
GAUZE SPONGE 4X4 12PLY STRL (GAUZE/BANDAGES/DRESSINGS) ×2 IMPLANT
GAUZE SPONGE 4X4 12PLY STRL LF (GAUZE/BANDAGES/DRESSINGS) ×1 IMPLANT
GLOVE BIOGEL PI IND STRL 8 (GLOVE) ×2 IMPLANT
GLOVE BIOGEL PI INDICATOR 8 (GLOVE) ×2
GLOVE ORTHO TXT STRL SZ7.5 (GLOVE) ×2 IMPLANT
GLOVE SURG ORTHO 8.0 STRL STRW (GLOVE) ×2 IMPLANT
GOWN STRL REUS W/ TWL LRG LVL3 (GOWN DISPOSABLE) ×1 IMPLANT
GOWN STRL REUS W/ TWL XL LVL3 (GOWN DISPOSABLE) ×2 IMPLANT
GOWN STRL REUS W/TWL LRG LVL3 (GOWN DISPOSABLE) ×4
GOWN STRL REUS W/TWL XL LVL3 (GOWN DISPOSABLE) ×4
IV NS IRRIG 3000ML ARTHROMATIC (IV SOLUTION) ×8 IMPLANT
KIT BASIN OR (CUSTOM PROCEDURE TRAY) ×2 IMPLANT
KIT TURNOVER KIT B (KITS) ×2 IMPLANT
MANIFOLD NEPTUNE II (INSTRUMENTS) ×2 IMPLANT
NDL SCORPION MULTI FIRE (NEEDLE) IMPLANT
NDL SPNL 18GX3.5 QUINCKE PK (NEEDLE) ×1 IMPLANT
NDL SUT .5 MAYO 1.404X.05X (NEEDLE) ×1 IMPLANT
NEEDLE 22X1 1/2 (OR ONLY) (NEEDLE) ×1 IMPLANT
NEEDLE MAYO TAPER (NEEDLE)
NEEDLE SCORPION MULTI FIRE (NEEDLE) IMPLANT
NEEDLE SPNL 18GX3.5 QUINCKE PK (NEEDLE) ×2 IMPLANT
NS IRRIG 1000ML POUR BTL (IV SOLUTION) ×2 IMPLANT
PACK SHOULDER (CUSTOM PROCEDURE TRAY) ×2 IMPLANT
PAD ABD 8X10 STRL (GAUZE/BANDAGES/DRESSINGS) ×2 IMPLANT
PAD ARMBOARD 7.5X6 YLW CONV (MISCELLANEOUS) ×4 IMPLANT
PROBE APOLLO 90XL (SURGICAL WAND) ×1 IMPLANT
SLING ARM IMMOBILIZER LRG (SOFTGOODS) IMPLANT
SPONGE LAP 4X18 RFD (DISPOSABLE) ×3 IMPLANT
SUCTION FRAZIER HANDLE 10FR (MISCELLANEOUS) ×1
SUCTION TUBE FRAZIER 10FR DISP (MISCELLANEOUS) ×1 IMPLANT
SUT ETHIBOND NAB CT1 #1 30IN (SUTURE) ×4 IMPLANT
SUT ETHILON 3 0 PS 1 (SUTURE) IMPLANT
SUT FIBERWIRE #2 38 T-5 BLUE (SUTURE)
SUT MNCRL AB 3-0 PS2 18 (SUTURE) IMPLANT
SUT TIGER TAPE 7 IN WHITE (SUTURE) IMPLANT
SUT VIC AB 0 CT1 27 (SUTURE) ×2
SUT VIC AB 0 CT1 27XBRD ANBCTR (SUTURE) ×1 IMPLANT
SUT VIC AB 2-0 CT1 27 (SUTURE) ×2
SUT VIC AB 2-0 CT1 TAPERPNT 27 (SUTURE) ×1 IMPLANT
SUTURE FIBERWR #2 38 T-5 BLUE (SUTURE) IMPLANT
SYR 20ML ECCENTRIC (SYRINGE) IMPLANT
SYR CONTROL 10ML LL (SYRINGE) IMPLANT
TAPE CLOTH SURG 6X10 WHT LF (GAUZE/BANDAGES/DRESSINGS) ×1 IMPLANT
TOWEL GREEN STERILE (TOWEL DISPOSABLE) ×2 IMPLANT
TOWEL GREEN STERILE FF (TOWEL DISPOSABLE) ×2 IMPLANT
TUBING ARTHROSCOPY IRRIG 16FT (MISCELLANEOUS) ×2 IMPLANT
WAND STAR VAC 90 (SURGICAL WAND) IMPLANT
WATER STERILE IRR 1000ML POUR (IV SOLUTION) ×2 IMPLANT

## 2019-05-27 NOTE — Anesthesia Postprocedure Evaluation (Signed)
Anesthesia Post Note  Patient: Ana Walker  Procedure(s) Performed: RIGHT SHOULDER ARTHROSCOPY WITH DEBRIDEMENT, ACROMIOBLASTY AND DISTAL CLAVICLE EXCISION D (Right Shoulder)     Patient location during evaluation: PACU Anesthesia Type: General Level of consciousness: awake and alert and oriented Pain management: pain level controlled Vital Signs Assessment: post-procedure vital signs reviewed and stable Respiratory status: spontaneous breathing, nonlabored ventilation and respiratory function stable Cardiovascular status: blood pressure returned to baseline Postop Assessment: no apparent nausea or vomiting Anesthetic complications: no    Last Vitals:  Vitals:   05/27/19 1008 05/27/19 1015  BP: 135/74 (!) 141/82  Pulse: 77   Resp: 20   Temp: 36.4 C   SpO2: 97%     Last Pain:  Vitals:   05/27/19 1015  TempSrc:   PainSc: [place]

## 2019-05-27 NOTE — Anesthesia Procedure Notes (Signed)
Procedure Name: Intubation Date/Time: 05/27/2019 7:49 AM Performed by: Harden Mo, CRNA Pre-anesthesia Checklist: Patient identified, Emergency Drugs available, Suction available and Patient being monitored Patient Re-evaluated:Patient Re-evaluated prior to induction Oxygen Delivery Method: Circle System Utilized Preoxygenation: Pre-oxygenation with 100% oxygen Induction Type: IV induction Ventilation: Mask ventilation without difficulty Laryngoscope Size: Mac and 3 Grade View: Grade I Tube type: Oral Tube size: 7.5 mm Number of attempts: 1 Airway Equipment and Method: Stylet and Oral airway Placement Confirmation: ETT inserted through vocal cords under direct vision,  positive ETCO2 and breath sounds checked- equal and bilateral Secured at: 22 cm Tube secured with: Tape Dental Injury: Teeth and Oropharynx as per pre-operative assessment

## 2019-05-27 NOTE — Discharge Instructions (Signed)
Diet: As you were doing prior to hospitalization   Activity: Increase activity slowly as tolerated  No lifting or driving for 48 hours  Shower: May shower without a dressing on post op day #2, NO SOAKING in tub   Dressing: You may change your dressing on post op day #2.  Then change the dressing daily with sterile 4"x4"s gauze dressing  Or band aids. You may remove sling in 48-72 hours, encourage early motion as tolerated.   Weight Bearing: weight bearing as tolerated right arm.  To prevent constipation: you may use a stool softener such as -  Colace ( over the counter) 100 mg by mouth twice a day  Drink plenty of fluids ( prune juice may be helpful) and high fiber foods  Miralax ( over the counter) for constipation as needed.   Precautions: If you experience chest pain or shortness of breath - call 911 immediately For transfer to the hospital emergency department!!  If you develop a fever greater that 101 F, purulent drainage from wound, increased redness or drainage from wound, or calf pain -- Call the office   Follow- Up Appointment: Please call for an appointment to be seen in 2 weeks or as previously scheduled [facility] - [phone]

## 2019-05-27 NOTE — Transfer of Care (Signed)
Immediate Anesthesia Transfer of Care Note  Patient: Ana Walker  Procedure(s) Performed: RIGHT SHOULDER ARTHROSCOPY WITH DEBRIDEMENT, ACROMIOBLASTY AND DISTAL CLAVICLE EXCISION D (Right Shoulder)  Patient Location: PACU  Anesthesia Type:General and Regional  Level of Consciousness: awake, alert  and oriented  Airway & Oxygen Therapy: Patient Spontanous Breathing  Post-op Assessment: Report given to RN and Post -op Vital signs reviewed and stable  Post vital signs: Reviewed and stable  Last Vitals:  Vitals Value Taken Time  BP    Temp    Pulse    Resp    SpO2      Last Pain:  Vitals:   05/27/19 0637  TempSrc:   PainSc: 3       Patients Stated Pain Goal: 3 (17/91/50 5697)  Complications: No apparent anesthesia complications

## 2019-05-27 NOTE — Op Note (Signed)
Ana Walker, REDONDO MEDICAL RECORD [id number] ACCOUNT [id number] DATE OF BIRTH:1975/10/22 FACILITY: [facility] LOCATION: [facility]-PER[facility] PHYSICIAN:W. Abigayl Hor JR., MD  OPERATIVE REPORT  DATE OF PROCEDURE:  05/27/2019  PREOPERATIVE DIAGNOSES: 1.  Partial rotator cuff tear (traumatic). 2.  Posttraumatic acromioclavicular arthritis. 3.  Impingement.  OPERATION: 1.  Arthroscopic debridement, extensive. 2.  Arthroscopic acromioplasty with release of coracoacromial ligament. 3.  Arthroscopic excision distal clavicle, all for the right shoulder.  SURGEON:  Vangie Bicker, MD  ASSISTANT:  Jennet Maduro, PA  DESCRIPTION OF PROCEDURE:  Examination under anesthesia showed normal range of motion with no instability.  She was arthroscoped through posterior lateral and anterior portals intraarticularly.  She did have some what appeared to be degenerative tearing  of the anterior superior labrum.  Aggressive debridement was carried out of this.  Full-width partial-thickness tear of the rotator cuff tear estimated to be about 15% to 20% thickness debrided.  No full-thickness component appreciated.  Glenohumeral  joint articular surfaces were normal.  Again, intraarticular debridement was carried out.  Subacromial space was hypertrophied and inflamed.  We performed a bursectomy, release of the CA ligament, as well as an acromioplasty.  AC joint showed significant  chondral damage, and we excised about a centimeter of the distal clavicle.  The superior surface of the cuff showed an abrasion-type phenomenon, but no full-thickness component, and again bursectomy were carried out.  Shoulder was drained free of fluid,  portals were closed with nylon, and was placed in a sling.  Taken to recovery room in stable condition.  LN/NUANCE  D:05/27/2019 T:05/27/2019 JOB:[id number]

## 2019-05-27 NOTE — Brief Op Note (Signed)
05/27/2019  9:03 AM  PATIENT:  Ana Walker  43 y.o. female  PRE-OPERATIVE DIAGNOSIS:  OTHER ARTICULAR CARTILAGE DISORDERS OF RIGHT SHOULDER, PRIMARY OSTEOARTHRITIS OF RIGHT SHOULDER, BURSITIS OF RIGHT SHOULDER  POST-OPERATIVE DIAGNOSIS:  OTHER ARTICULAR CARTILAGE DISORDERS OF RIGHT SHOULDER, PRIMARY OSTEOARTHRITIS OF RIGHT SHOULDER, BURSITIS OF RIGHT SHOULDER  PROCEDURE:  Procedure(s) with comments: RIGHT SHOULDER ARTHROSCOPY WITH DEBRIDEMENT, ACROMIOBLASTY AND DISTAL CLAVICLE EXCISION D (Right) - GENERAL, PRE/POST OP SCALENE  SURGEON:  Surgeon(s) and Role:    * Earlie Server, MD - Primary  PHYSICIAN ASSISTANT: Chriss Czar, PA-C  ASSISTANTS:    ANESTHESIA:   regional and general  EBL:  50 mL   BLOOD ADMINISTERED:none  DRAINS: none   LOCAL MEDICATIONS USED:  NONE  SPECIMEN:  No Specimen  DISPOSITION OF SPECIMEN:  N/A  COUNTS:  YES  TOURNIQUET:  * No tourniquets in log *  DICTATION: .Other Dictation: Dictation Number unknown  PLAN OF CARE: Discharge to home after PACU  PATIENT DISPOSITION:  PACU - hemodynamically stable.   Delay start of Pharmacological VTE agent (>24hrs) due to surgical blood loss or risk of bleeding: not applicable

## 2019-05-27 NOTE — Anesthesia Procedure Notes (Signed)
Anesthesia Regional Block: Interscalene brachial plexus block   Pre-Anesthetic Checklist: ,, timeout performed, Correct Patient, Correct Site, Correct Laterality, Correct Procedure, Correct Position, site marked, Risks and benefits discussed, pre-op evaluation,  At surgeon's request and post-op pain management  Laterality: Right  Prep: Maximum Sterile Barrier Precautions used, chloraprep       Needles:  Injection technique: Single-shot  Needle Type: Echogenic Stimulator Needle     Needle Length: 9cm  Needle Gauge: 22     Additional Needles:   Procedures:,,,, ultrasound used (permanent image in chart),,,,  Narrative:  Start time: 05/27/2019 7:14 AM End time: 05/27/2019 7:16 AM Injection made incrementally with aspirations every 5 mL.  Performed by: Personally  Anesthesiologist: Brennan Bailey, MD  Additional Notes: Risks, benefits, and alternative discussed. Patient gave consent for procedure. Patient prepped and draped in sterile fashion. Sedation administered, patient remains easily responsive to voice. Relevant anatomy identified with ultrasound guidance. Local anesthetic given in 5cc increments with no signs or symptoms of intravascular injection. No pain or paraesthesias with injection. Patient monitored throughout procedure with signs of LAST or immediate complications. Tolerated well. Ultrasound image placed in chart.  Tawny Asal, MD

## 2019-05-27 NOTE — Interval H&P Note (Signed)
History and Physical Interval Note:  05/27/2019 7:32 AM  Ana Walker  has presented today for surgery, with the diagnosis of OTHER ARTICULAR CARTILAGE DISORDERS OF RIGHT SHOULDER, PRIMARY OSTEOARTHRITIS OF RIGHT SHOULDER, BURSITIS OF RIGHT SHOULDER.  The various methods of treatment have been discussed with the patient and family. After consideration of risks, benefits and other options for treatment, the patient has consented to  Procedure(s) with comments: RIGHT SHOULDER ARTHROSCOPY WITH SUBACROMIAL DECOMPRESSION, DISTAL CLAVICLE EXCISION, POSSIBLE OPEN ROTATOR CUFF REPAIR (Right) - GENERAL, PRE/POST OP SCALENE as a surgical intervention.  The patient's history has been reviewed, patient examined, no change in status, stable for surgery.  I have reviewed the patient's chart and labs.  Questions were answered to the patient's satisfaction.     Yvette Rack

## 2019-05-28 ENCOUNTER — Encounter (HOSPITAL_COMMUNITY): Payer: Self-pay | Admitting: Orthopedic Surgery

## 2019-05-30 DIAGNOSIS — M25511 Pain in right shoulder: Secondary | ICD-10-CM | POA: Diagnosis not present

## 2019-06-09 DIAGNOSIS — M25511 Pain in right shoulder: Secondary | ICD-10-CM | POA: Diagnosis not present

## 2019-06-09 DIAGNOSIS — M25611 Stiffness of right shoulder, not elsewhere classified: Secondary | ICD-10-CM | POA: Diagnosis not present

## 2019-06-12 DIAGNOSIS — M25511 Pain in right shoulder: Secondary | ICD-10-CM | POA: Diagnosis not present

## 2019-06-12 DIAGNOSIS — M25611 Stiffness of right shoulder, not elsewhere classified: Secondary | ICD-10-CM | POA: Diagnosis not present

## 2019-06-16 DIAGNOSIS — M25611 Stiffness of right shoulder, not elsewhere classified: Secondary | ICD-10-CM | POA: Diagnosis not present

## 2019-06-16 DIAGNOSIS — M25511 Pain in right shoulder: Secondary | ICD-10-CM | POA: Diagnosis not present

## 2019-06-18 DIAGNOSIS — M25611 Stiffness of right shoulder, not elsewhere classified: Secondary | ICD-10-CM | POA: Diagnosis not present

## 2019-06-18 DIAGNOSIS — M25511 Pain in right shoulder: Secondary | ICD-10-CM | POA: Diagnosis not present

## 2019-06-19 DIAGNOSIS — Z0000 Encounter for general adult medical examination without abnormal findings: Secondary | ICD-10-CM | POA: Diagnosis not present

## 2019-06-19 DIAGNOSIS — F411 Generalized anxiety disorder: Secondary | ICD-10-CM | POA: Diagnosis not present

## 2019-06-19 DIAGNOSIS — G4730 Sleep apnea, unspecified: Secondary | ICD-10-CM | POA: Diagnosis not present

## 2019-06-19 DIAGNOSIS — E669 Obesity, unspecified: Secondary | ICD-10-CM | POA: Diagnosis not present

## 2019-06-19 DIAGNOSIS — R739 Hyperglycemia, unspecified: Secondary | ICD-10-CM | POA: Diagnosis not present

## 2019-06-19 DIAGNOSIS — Z23 Encounter for immunization: Secondary | ICD-10-CM | POA: Diagnosis not present

## 2019-06-20 DIAGNOSIS — M25511 Pain in right shoulder: Secondary | ICD-10-CM | POA: Diagnosis not present

## 2019-06-23 DIAGNOSIS — M25611 Stiffness of right shoulder, not elsewhere classified: Secondary | ICD-10-CM | POA: Diagnosis not present

## 2019-06-23 DIAGNOSIS — M25511 Pain in right shoulder: Secondary | ICD-10-CM | POA: Diagnosis not present

## 2019-06-25 DIAGNOSIS — M25611 Stiffness of right shoulder, not elsewhere classified: Secondary | ICD-10-CM | POA: Diagnosis not present

## 2019-06-25 DIAGNOSIS — M25511 Pain in right shoulder: Secondary | ICD-10-CM | POA: Diagnosis not present
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
## 2019-10-16 DIAGNOSIS — Z0271 Encounter for disability determination: Secondary | ICD-10-CM

## 2019-10-21 ENCOUNTER — Ambulatory Visit: Payer: Self-pay | Admitting: Skilled Nursing Facility1

## 2019-10-31 ENCOUNTER — Encounter: Payer: Self-pay | Admitting: Dietician

## 2019-10-31 ENCOUNTER — Other Ambulatory Visit: Payer: Self-pay

## 2019-10-31 ENCOUNTER — Encounter: Payer: 59 | Attending: General Surgery | Admitting: Dietician

## 2019-10-31 DIAGNOSIS — E669 Obesity, unspecified: Secondary | ICD-10-CM | POA: Diagnosis present

## 2019-10-31 NOTE — Progress Notes (Signed)
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 Snacks include nuts, beef sticks, baby bel cheese, Parm Crisps, Atkins snacks, Skinny Pop, vienna sausages, beef jerky, etc. May have [employer] roast beef sandwich without bread, [employer]ee[employer] burger on lettuce wrap, hard boiled eggs, meatballs, summer sausage with mustard.   24-Hr Dietary Recall First Meal: omelet in a mug (eggs + cheese + bacon crumbles)  Snack: peanuts   Second Meal: hamburger patty + provolone cheese + A1 sauce + cauliflower mac & cheese Snack: ham & cheese roll ups  Third Meal: salami + olives + feta cheese + oil & vinegar  Snack: 72% cocoa chocolate square  Beverages: Nature's Twist, water, decaf tea w/ Sweet n' Low  Estimated daily fluid intake: ~64 oz Estimated daily protein intake: 60 g Supplements: [employer] w/ 60m Iron; Viactive chews  Current average weekly physical activity: treadmill 2 days/week    Post-Op Goals/ Signs/ Symptoms Using straws: sometimes  Drinking while eating: no Chewing/swallowing difficulties: no Changes in vision: no  Changes to mood/headaches: no Hair loss/changes to skin/nails: no Difficulty focusing/concentrating: no Sweating: no Dizziness/lightheadedness:  no Palpitations: no  Carbonated/caffeinated beverages: rarely carbonation N/V/D/C/Gas: no Abdominal pain: no Dumping syndrome: no   NUTRITION DIAGNOSIS  Overweight/obesity (Cobb-3.3) related to past poor dietary habits and physical inactivity as evidenced by completed bariatric surgery and following dietary guidelines for continued weight loss and healthy nutrition status.   NUTRITION INTERVENTION Nutrition counseling (C-1) and education (E-2) to facilitate bariatric surgery goals, including: . Diet education on appropriate bariatric foods/fluids (phase 7)  . The importance of consuming adequate calories as well as certain nutrients daily due to the body's need for essential vitamins, minerals, and fats . The importance of daily physical activity and to reach a goal of at least 150 minutes of moderate to vigorous physical activity weekly (or as directed by their physician) due to benefits such as increased musculature and improved lab values  Handouts Provided Include   Phase 7: Lifelong Maintenance   Bariatric MyPlate  Learning Style & Readiness for Change Teaching method utilized: Visual & Auditory  Demonstrated degree of understanding via: Teach Back  Barriers to learning/adherence to lifestyle change: None Identified   Goals Established by Patient   Do not drink fluids with foods (15 minutes before, during, and 30 minutes after.)   Choose snacks that are high in protein with 9 grams or less of fat and sugar  per serving.   Balance meals with plenty of non-starchy vegetables and try to eat 2 servings of these per day.   Aim for 64 ounces of fluid total with at least half being plain water.   Continue taking bariatric MVI and calcium daily.   Increase physical activity to 150 minutes per week.    MONITORING & EVALUATION Dietary intake, weekly physical activity, body weight, and goals in about 3-6 months.  Next Steps Patient is to contact [facility] to schedule next follow-up  in 3-6 months.

## 2019-10-31 NOTE — Patient Instructions (Addendum)
Try to get back to keeping foods and fluids separate. No drinking anything about 15-30 minutes before eating, during, or 30 minutes after.   Use the Snack Ideas sheet to help choose good snack options, and remember to keep the grams of fat and sugar in the single digits (9 grams or less) per serving.   Refer to the Phase 7 sheet for guidance and reminders of your main goals. Good job on keeping up with fluids, taking your vitamins/minerals, and getting back into physical activity!   Please reach out with questions/concerns!

## 2019-12-12 ENCOUNTER — Other Ambulatory Visit: Payer: Self-pay | Admitting: Surgery

## 2020-01-07 DIAGNOSIS — Z0271 Encounter for disability determination: Secondary | ICD-10-CM

## 2020-01-09 ENCOUNTER — Other Ambulatory Visit (HOSPITAL_COMMUNITY): Payer: 59

## 2020-01-09 NOTE — Pre-Procedure Instructions (Signed)
[facility] [address], [place] - [address] Dol[place]is Hall [address] [place] [address] Phone: [phone] Fax: [phone]  [id number] - [place], [place] - [address] [place] [address] Phone: [phone] Fax: [phone]     Your procedure is scheduled on Tuesday, April, 20 from 1:15 PM- 2:15 PM.  Report to [facility] Main Entrance "A" at 11:15 A.M., and check in at the Admitting office.  Call this number if you have problems the morning of surgery:  [phone]  Call [phone] if you have any questions prior to your surgery date Monday-Friday 8am-4pm.    Remember:  Do not eat after midnight the night before your surgery.  You may drink clear liquids until 10:15 AM the morning of your surgery.   Clear liquids allowed are: Water, Non-Citrus Juices (without pulp), Carbonated Beverages, Clear Tea, Black Coffee Only, and Gatorade.  . The day of surgery:  o Drink ONE (1) Pre-Surgery Clear Ensure by 10:15 AM.   o This drink was given to you during your hospital  pre-op appointment visit.  o Nothing else to drink after completing the  Pre-Surgery Clear Ensure.      Take these medicines the morning of surgery with A SIP OF WATER : FLUoxetine (PROZAC) montelukast (SINGULAIR)   IF NEEDED: ALPRAZolam (XANAX) oxyCODONE-acetaminophen (PERCOCET/ROXICET) fluticasone (FLONASE) nasal spray  As of today, STOP taking any Aspirin (unless otherwise instructed by your surgeon) and Aspirin containing products, Aleve, Naproxen, Ibuprofen, Motrin, Advil, Goody's, BC's, all herbal medications, fish oil, and all vitamins.                      Do not wear jewelry, make up, or nail polish.            Do not wear lotions, powders, perfumes, or deodorant.            Do not shave 48 hours prior to surgery.              Do not bring valuables to the hospital.            [facility] is not responsible for any belongings or  valuables.  Do NOT Smoke (Tobacco/Vapping) or drink Alcohol 24 hours prior to your procedure.  If you use a CPAP at night, you may bring all equipment for your overnight stay.   Contacts, glasses, dentures or bridgework may not be worn into surgery.      For patients admitted to the hospital, discharge time will be determined by your treatment team.   Patients discharged the day of surgery will not be allowed to drive home, and someone needs to stay with them for 24 hours.    Special instructions:   [place]- Preparing For Surgery  Before surgery, you can play an important role. Because skin is not sterile, your skin needs to be as free of germs as possible. You can reduce the number of germs on your skin by washing with CHG (chlorahexidine gluconate) Soap before surgery.  CHG is an antiseptic cleaner which kills germs and bonds with the skin to continue killing germs even after washing.    Oral Hygiene is also important to reduce your risk of infection.  Remember - BRUSH YOUR TEETH THE MORNING OF SURGERY WITH YOUR REGULAR TOOTHPASTE  Please do not use if you have an allergy to CHG or antibacterial soaps. If your skin becomes  reddened/irritated stop using the CHG.  Do not shave (including legs and underarms) for at least 48 hours prior to first CHG shower. It is OK to shave your face.  Please follow these instructions carefully.   1. Shower the NIGHT BEFORE SURGERY and the MORNING OF SURGERY with CHG Soap.   2. If you chose to wash your hair, wash your hair first as usual with your normal shampoo.  3. After you shampoo, rinse your hair and body thoroughly to remove the shampoo.  4. Use CHG as you would any other liquid soap. You can apply CHG directly to the skin and wash gently with a scrungie or a clean washcloth.   5. Apply the CHG Soap to your body ONLY FROM THE NECK DOWN.  Do not use on open wounds or open sores. Avoid contact with your eyes, ears, mouth and genitals  (private parts). Wash Face and genitals (private parts)  with your normal soap.   6. Wash thoroughly, paying special attention to the area where your surgery will be performed.  7. Thoroughly rinse your body with warm water from the neck down.  8. DO NOT shower/wash with your normal soap after using and rinsing off the CHG Soap.  9. Pat yourself dry with a CLEAN TOWEL.  10. Wear CLEAN PAJAMAS to bed the night before surgery, wear comfortable clothes the morning of surgery  11. Place CLEAN SHEETS on your bed the night of your first shower and DO NOT SLEEP WITH PETS.   Day of Surgery:   Do not apply any deodorants/lotions.  Please wear clean clothes to the hospital/surgery center.   Remember to brush your teeth WITH YOUR REGULAR TOOTHPASTE.   Please read over the following fact sheets that you were given.

## 2020-01-12 ENCOUNTER — Other Ambulatory Visit: Payer: Self-pay

## 2020-01-12 ENCOUNTER — Encounter (HOSPITAL_COMMUNITY)
Admission: RE | Admit: 2020-01-12 | Discharge: 2020-01-12 | Disposition: A | Discharge status: Home / Self Care | Payer: 59 | Source: Ambulatory Visit | Attending: Surgery | Admitting: Surgery

## 2020-01-12 ENCOUNTER — Other Ambulatory Visit (HOSPITAL_COMMUNITY)
Admission: RE | Admit: 2020-01-12 | Discharge: 2020-01-12 | Disposition: A | Discharge status: Home / Self Care | Payer: 59 | Source: Ambulatory Visit | Attending: Surgery | Admitting: Surgery

## 2020-01-12 ENCOUNTER — Encounter (HOSPITAL_COMMUNITY): Payer: Self-pay

## 2020-01-12 DIAGNOSIS — Z01812 Encounter for preprocedural laboratory examination: Secondary | ICD-10-CM | POA: Diagnosis present

## 2020-01-12 DIAGNOSIS — Z20822 Contact with and (suspected) exposure to covid-19: Secondary | ICD-10-CM | POA: Insufficient documentation

## 2020-01-12 LAB — CBC
HCT: 38 % (ref 36.0–46.0)
Hemoglobin: 11.7 g/dL — ABNORMAL LOW (ref 12.0–15.0)
MCH: 28.5 pg (ref 26.0–34.0)
MCHC: 30.8 g/dL (ref 30.0–36.0)
MCV: 92.7 fL (ref 80.0–100.0)
Platelets: 235 10*3/uL (ref 150–400)
RBC: 4.1 MIL/uL (ref 3.87–5.11)
RDW: 13.9 % (ref 11.5–15.5)
WBC: 8 10*3/uL (ref 4.0–10.5)
nRBC: 0 % (ref 0.0–0.2)

## 2020-01-12 LAB — SARS CORONAVIRUS 2 (TAT 6-24 HRS): SARS Coronavirus 2: NEGATIVE

## 2020-01-12 LAB — SURGICAL PCR SCREEN
MRSA, PCR: NEGATIVE
Staphylococcus aureus: NEGATIVE

## 2020-01-12 MED ORDER — DEXTROSE 5 % IV SOLN
3.0000 g | INTRAVENOUS | Status: AC
  Administered 2020-01-13: 3 g via INTRAVENOUS
  Filled 2020-01-12: qty 3
  Filled 2020-01-12: qty 3000

## 2020-01-12 NOTE — Progress Notes (Signed)
PCP - Dr. Almond Lint Cardiologist - denies  PPM/ICD - denies  Chest x-ray - N/A EKG - N/A Stress Test - denies  ECHO - per patient "due to work-induced stress, had one done in 2018 it was normal" - records requested Cardiac Cath - denies  Sleep Study/CPAP - Yes, per patient "wears it every night"  DM: denies  Blood Thinner Instructions: N/A Aspirin Instructions: N/A  ERAS Protcol - Yes PRE-SURGERY Ensure or G2- Ensure given  COVID TEST- Scheduled for today 01/12/2020 after PAT appointment. Patient verbalized understanding of self-quarantine instructions, appointment time and place.  Anesthesia review: YES, records requested.  Patient denies shortness of breath, fever, cough and chest pain at PAT appointment  All instructions explained to the patient, with a verbal understanding of the material. Patient agrees to go over the instructions while at home for a better understanding. Patient also instructed to self quarantine after being tested for COVID-19. The opportunity to ask questions was provided.

## 2020-01-12 NOTE — Anesthesia Preprocedure Evaluation (Addendum)
Anesthesia Evaluation  Patient identified by MRN, date of birth, ID band Patient awake    Reviewed: Allergy & Precautions, NPO status , Patient's Chart, lab work & pertinent test results  History of Anesthesia Complications Negative for: history of anesthetic complications  Airway Mallampati: III  TM Distance: >3 FB Neck ROM: Full    Dental  (+) Dental Advisory Given   Pulmonary sleep apnea , neg recent URI,    breath sounds clear to auscultation       Cardiovascular negative cardio ROS   Rhythm:Regular     Neuro/Psych PSYCHIATRIC DISORDERS Anxiety negative neurological ROS     GI/Hepatic negative GI ROS, Neg liver ROS,   Endo/Other  Morbid obesity  Renal/GU negative Renal ROS     Musculoskeletal negative musculoskeletal ROS (+)   Abdominal   Peds  Hematology negative hematology ROS (+)   Anesthesia Other Findings   Reproductive/Obstetrics                             Anesthesia Physical Anesthesia Plan  ASA: III  Anesthesia Plan: General   Post-op Pain Management:    Induction: Intravenous  PONV Risk Score and Plan: 3 and Ondansetron and Dexamethasone  Airway Management Planned: LMA and Oral ETT  Additional Equipment: None  Intra-op Plan:   Post-operative Plan: Extubation in OR  Informed Consent: I have reviewed the patients History and Physical, chart, labs and discussed the procedure including the risks, benefits and alternatives for the proposed anesthesia with the patient or authorized representative who has indicated his/her understanding and acceptance.     Dental advisory given  Plan Discussed with: CRNA and Surgeon  Anesthesia Plan Comments: (Pt had a stress echo in 2018 to eval episode of chest discomfort which was showed normal LV function with appropriate stress response.   Stress echo 04/25/17 (copy on chart): Conclusions: 1.  Baseline images show normal  LV function with normal wall motion.  Peak stress images show appropriate augmentation of LV function with hyperdynamic response and no evidence of wall motion abnormality. 2.  Normal dobutamine stress echocardiogram.)       Anesthesia Quick Evaluation
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
## 2020-01-13 ENCOUNTER — Encounter (HOSPITAL_COMMUNITY)
Admission: RE | Disposition: A | Discharge status: Home / Self Care | Payer: Self-pay | Source: Home / Self Care | Attending: Surgery

## 2020-01-13 ENCOUNTER — Ambulatory Visit (HOSPITAL_COMMUNITY)
Admission: RE | Admit: 2020-01-13 | Discharge: 2020-01-13 | Disposition: A | Discharge status: Home / Self Care | Payer: No Typology Code available for payment source | Attending: Surgery | Admitting: Surgery

## 2020-01-13 ENCOUNTER — Other Ambulatory Visit: Payer: Self-pay

## 2020-01-13 ENCOUNTER — Ambulatory Visit (HOSPITAL_COMMUNITY): Payer: No Typology Code available for payment source | Admitting: Physician Assistant

## 2020-01-13 ENCOUNTER — Ambulatory Visit (HOSPITAL_COMMUNITY): Payer: No Typology Code available for payment source | Admitting: Anesthesiology

## 2020-01-13 ENCOUNTER — Encounter (HOSPITAL_COMMUNITY): Payer: Self-pay | Admitting: Surgery

## 2020-01-13 DIAGNOSIS — N63 Unspecified lump in unspecified breast: Secondary | ICD-10-CM | POA: Diagnosis present

## 2020-01-13 DIAGNOSIS — Z8616 Personal history of COVID-19: Secondary | ICD-10-CM | POA: Diagnosis not present

## 2020-01-13 DIAGNOSIS — Z79899 Other long term (current) drug therapy: Secondary | ICD-10-CM | POA: Diagnosis not present

## 2020-01-13 DIAGNOSIS — N641 Fat necrosis of breast: Secondary | ICD-10-CM | POA: Diagnosis not present

## 2020-01-13 HISTORY — PX: BREAST CYST EXCISION: SHX579

## 2020-01-13 LAB — POCT PREGNANCY, URINE: Preg Test, Ur: NEGATIVE

## 2020-01-13 LAB — GLUCOSE, CAPILLARY: Glucose-Capillary: 103 mg/dL — ABNORMAL HIGH (ref 70–99)

## 2020-01-13 SURGERY — EXCISION, CYST, BREAST
Anesthesia: General | Site: Breast | Laterality: Right

## 2020-01-13 MED ORDER — ONDANSETRON HCL 4 MG/2ML IJ SOLN
INTRAMUSCULAR | Status: AC
  Filled 2020-01-13: qty 2

## 2020-01-13 MED ORDER — OXYCODONE HCL 5 MG/5ML PO SOLN: 5.0000 mg | Freq: Once | ORAL | Status: AC | PRN

## 2020-01-13 MED ORDER — OXYCODONE HCL 5 MG PO TABS
5.0000 mg | ORAL_TABLET | Freq: Once | ORAL | Status: AC | PRN
  Administered 2020-01-13: 5 mg via ORAL

## 2020-01-13 MED ORDER — EPHEDRINE SULFATE 50 MG/ML IJ SOLN
INTRAMUSCULAR | Status: DC | PRN
  Administered 2020-01-13: 10 mg via INTRAVENOUS

## 2020-01-13 MED ORDER — MIDAZOLAM HCL 2 MG/2ML IJ SOLN
INTRAMUSCULAR | Status: DC | PRN
  Administered 2020-01-13: 2 mg via INTRAVENOUS

## 2020-01-13 MED ORDER — DEXAMETHASONE SODIUM PHOSPHATE 10 MG/ML IJ SOLN
INTRAMUSCULAR | Status: AC
  Filled 2020-01-13: qty 1

## 2020-01-13 MED ORDER — DEXAMETHASONE SODIUM PHOSPHATE 10 MG/ML IJ SOLN
INTRAMUSCULAR | Status: DC | PRN
  Administered 2020-01-13: 5 mg via INTRAVENOUS

## 2020-01-13 MED ORDER — FENTANYL CITRATE (PF) 100 MCG/2ML IJ SOLN: 25.0000 ug | INTRAMUSCULAR | Status: DC | PRN

## 2020-01-13 MED ORDER — LIDOCAINE 2% (20 MG/ML) 5 ML SYRINGE
INTRAMUSCULAR | Status: AC
  Filled 2020-01-13: qty 5

## 2020-01-13 MED ORDER — CELECOXIB 200 MG PO CAPS
ORAL_CAPSULE | ORAL | Status: AC
  Administered 2020-01-13: 400 mg via ORAL
  Filled 2020-01-13: qty 2

## 2020-01-13 MED ORDER — LIDOCAINE 2% (20 MG/ML) 5 ML SYRINGE
INTRAMUSCULAR | Status: DC | PRN
  Administered 2020-01-13: 60 mg via INTRAVENOUS

## 2020-01-13 MED ORDER — OXYCODONE HCL 5 MG PO TABS: 5.0000 mg | ORAL_TABLET | Freq: Four times a day (QID) | ORAL | 0 refills | Status: DC | PRN

## 2020-01-13 MED ORDER — ACETAMINOPHEN 10 MG/ML IV SOLN: 1000.0000 mg | Freq: Once | INTRAVENOUS | Status: DC | PRN

## 2020-01-13 MED ORDER — 0.9 % SODIUM CHLORIDE (POUR BTL) OPTIME
TOPICAL | Status: DC | PRN
  Administered 2020-01-13: 1000 mL

## 2020-01-13 MED ORDER — CHLORHEXIDINE GLUCONATE CLOTH 2 % EX PADS: 6.0000 | MEDICATED_PAD | Freq: Once | CUTANEOUS | Status: DC

## 2020-01-13 MED ORDER — FENTANYL CITRATE (PF) 250 MCG/5ML IJ SOLN
INTRAMUSCULAR | Status: DC | PRN
  Administered 2020-01-13 (×2): 50 ug via INTRAVENOUS

## 2020-01-13 MED ORDER — GLYCOPYRROLATE 0.2 MG/ML IJ SOLN
INTRAMUSCULAR | Status: DC | PRN
  Administered 2020-01-13: .2 mg via INTRAVENOUS

## 2020-01-13 MED ORDER — PROPOFOL 10 MG/ML IV BOLUS
INTRAVENOUS | Status: AC
  Filled 2020-01-13: qty 20

## 2020-01-13 MED ORDER — BUPIVACAINE HCL (PF) 0.25 % IJ SOLN
INTRAMUSCULAR | Status: DC | PRN
  Administered 2020-01-13: 14 mL

## 2020-01-13 MED ORDER — EPHEDRINE SULFATE-NACL 50-0.9 MG/10ML-% IV SOSY
PREFILLED_SYRINGE | INTRAVENOUS | Status: DC | PRN
  Administered 2020-01-13: 15 mg via INTRAVENOUS

## 2020-01-13 MED ORDER — LACTATED RINGERS IV SOLN: INTRAVENOUS | Status: DC

## 2020-01-13 MED ORDER — BUPIVACAINE HCL (PF) 0.25 % IJ SOLN
INTRAMUSCULAR | Status: AC
  Filled 2020-01-13: qty 30

## 2020-01-13 MED ORDER — MIDAZOLAM HCL 2 MG/2ML IJ SOLN
INTRAMUSCULAR | Status: AC
  Filled 2020-01-13: qty 2

## 2020-01-13 MED ORDER — CELECOXIB 200 MG PO CAPS: 400.0000 mg | ORAL_CAPSULE | ORAL | Status: AC

## 2020-01-13 MED ORDER — PROPOFOL 10 MG/ML IV BOLUS
INTRAVENOUS | Status: DC | PRN
  Administered 2020-01-13: 200 mg via INTRAVENOUS

## 2020-01-13 MED ORDER — FENTANYL CITRATE (PF) 250 MCG/5ML IJ SOLN
INTRAMUSCULAR | Status: AC
  Filled 2020-01-13: qty 5

## 2020-01-13 MED ORDER — ACETAMINOPHEN 500 MG PO TABS
ORAL_TABLET | ORAL | Status: AC
  Administered 2020-01-13: 1000 mg via ORAL
  Filled 2020-01-13: qty 2

## 2020-01-13 MED ORDER — ENSURE PRE-SURGERY PO LIQD: 296.0000 mL | Freq: Once | ORAL | Status: DC

## 2020-01-13 MED ORDER — OXYCODONE HCL 5 MG PO TABS
ORAL_TABLET | ORAL | Status: AC
  Filled 2020-01-13: qty 1

## 2020-01-13 MED ORDER — GABAPENTIN 300 MG PO CAPS
ORAL_CAPSULE | ORAL | Status: AC
  Administered 2020-01-13: 300 mg via ORAL
  Filled 2020-01-13: qty 1

## 2020-01-13 MED ORDER — ACETAMINOPHEN 500 MG PO TABS: 1000.0000 mg | ORAL_TABLET | ORAL | Status: AC

## 2020-01-13 MED ORDER — ONDANSETRON HCL 4 MG/2ML IJ SOLN
INTRAMUSCULAR | Status: DC | PRN
  Administered 2020-01-13: 4 mg via INTRAVENOUS

## 2020-01-13 MED ORDER — ACETAMINOPHEN 160 MG/5ML PO SOLN: 1000.0000 mg | Freq: Once | ORAL | Status: DC | PRN

## 2020-01-13 MED ORDER — ACETAMINOPHEN 500 MG PO TABS: 1000.0000 mg | ORAL_TABLET | Freq: Once | ORAL | Status: DC | PRN

## 2020-01-13 MED ORDER — PHENYLEPHRINE 40 MCG/ML (10ML) SYRINGE FOR IV PUSH (FOR BLOOD PRESSURE SUPPORT)
PREFILLED_SYRINGE | INTRAVENOUS | Status: AC
  Filled 2020-01-13: qty 10

## 2020-01-13 MED ORDER — GABAPENTIN 300 MG PO CAPS: 300.0000 mg | ORAL_CAPSULE | ORAL | Status: AC

## 2020-01-13 SURGICAL SUPPLY — 40 items
ADH SKN CLS APL DERMABOND .7 (GAUZE/BANDAGES/DRESSINGS) ×1
APL PRP STRL LF DISP 70% ISPRP (MISCELLANEOUS) ×1
BINDER BREAST LRG (GAUZE/BANDAGES/DRESSINGS) IMPLANT
BINDER BREAST XLRG (GAUZE/BANDAGES/DRESSINGS) IMPLANT
CANISTER SUCT 3000ML PPV (MISCELLANEOUS) ×1 IMPLANT
CHLORAPREP W/TINT 26 (MISCELLANEOUS) ×2 IMPLANT
COVER SURGICAL LIGHT HANDLE (MISCELLANEOUS) ×2 IMPLANT
COVER WAND RF STERILE (DRAPES) ×1 IMPLANT
DECANTER SPIKE VIAL GLASS SM (MISCELLANEOUS) ×1 IMPLANT
DERMABOND ADVANCED (GAUZE/BANDAGES/DRESSINGS) ×1
DERMABOND ADVANCED .7 DNX12 (GAUZE/BANDAGES/DRESSINGS) ×1 IMPLANT
DEVICE DUBIN SPECIMEN MAMMOGRA (MISCELLANEOUS) IMPLANT
DRAPE CHEST BREAST 15X10 FENES (DRAPES) ×1 IMPLANT
DRSG TEGADERM 4X4.75 (GAUZE/BANDAGES/DRESSINGS) ×1 IMPLANT
ELECT REM PT RETURN 9FT ADLT (ELECTROSURGICAL) ×2
ELECTRODE REM PT RTRN 9FT ADLT (ELECTROSURGICAL) ×1 IMPLANT
GAUZE 4X4 16PLY RFD (DISPOSABLE) ×2 IMPLANT
GAUZE SPONGE 2X2 8PLY STRL LF (GAUZE/BANDAGES/DRESSINGS) ×1 IMPLANT
GLOVE SURG SIGNA 7.5 PF LTX (GLOVE) ×2 IMPLANT
GOWN STRL REUS W/ TWL LRG LVL3 (GOWN DISPOSABLE) ×1 IMPLANT
GOWN STRL REUS W/ TWL XL LVL3 (GOWN DISPOSABLE) ×1 IMPLANT
GOWN STRL REUS W/TWL LRG LVL3 (GOWN DISPOSABLE) ×4
GOWN STRL REUS W/TWL XL LVL3 (GOWN DISPOSABLE) ×2
KIT BASIN OR (CUSTOM PROCEDURE TRAY) ×2 IMPLANT
KIT TURNOVER KIT B (KITS) ×2 IMPLANT
NDL HYPO 25GX1X1/2 BEV (NEEDLE) ×1 IMPLANT
NEEDLE HYPO 25GX1X1/2 BEV (NEEDLE) ×2 IMPLANT
NS IRRIG 1000ML POUR BTL (IV SOLUTION) ×2 IMPLANT
PACK GENERAL/GYN (CUSTOM PROCEDURE TRAY) ×2 IMPLANT
PAD ARMBOARD 7.5X6 YLW CONV (MISCELLANEOUS) ×4 IMPLANT
PENCIL SMOKE EVACUATOR (MISCELLANEOUS) ×2 IMPLANT
SPECIMEN JAR MEDIUM (MISCELLANEOUS) ×3 IMPLANT
SPONGE GAUZE 2X2 STER 10/PKG (GAUZE/BANDAGES/DRESSINGS)
SUT MNCRL AB 4-0 PS2 18 (SUTURE) ×2 IMPLANT
SUT SILK 2 0 PERMA HAND 18 BK (SUTURE) ×1 IMPLANT
SUT VIC AB 3-0 SH 27 (SUTURE) ×2
SUT VIC AB 3-0 SH 27X BRD (SUTURE) ×1 IMPLANT
SYR CONTROL 10ML LL (SYRINGE) ×2 IMPLANT
TOWEL GREEN STERILE (TOWEL DISPOSABLE) ×2 IMPLANT
TOWEL GREEN STERILE FF (TOWEL DISPOSABLE) ×2 IMPLANT

## 2020-01-13 NOTE — Interval H&P Note (Signed)
History and Physical Interval Note: no change in H and P  01/13/2020 12:25 PM  Ana Walker  has presented today for surgery, with the diagnosis of RIGHT BREAST MASS X 2.  The various methods of treatment have been discussed with the patient and family. After consideration of risks, benefits and other options for treatment, the patient has consented to  Procedure(s) with comments: [name] (Right) - LMA as a surgical intervention.  The patient's history has been reviewed, patient examined, no change in status, stable for surgery.  I have reviewed the patient's chart and labs.  Questions were answered to the patient's satisfaction.     Abigail Miyamoto

## 2020-01-13 NOTE — Transfer of Care (Signed)
Immediate Anesthesia Transfer of Care Note  Patient: Ovidio Kin  Procedure(s) Performed: [name] (Right Breast)  Patient Location: PACU  Anesthesia Type:General  Level of Consciousness: drowsy  Airway & Oxygen Therapy: Patient Spontanous Breathing and Patient connected to face mask oxygen  Post-op Assessment: Report given to RN  Post vital signs: Reviewed and stable  Last Vitals:  Vitals Value Taken Time  BP 148/87 01/13/20 1337  Temp    Pulse 86 01/13/20 1338  Resp 17 01/13/20 1338  SpO2 98 % 01/13/20 1338  Vitals shown include unvalidated device data.  Last Pain:  Vitals:   01/13/20 1205  TempSrc:   PainSc: 0-No pain         Complications: No apparent anesthesia complications
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
## 2020-01-13 NOTE — Discharge Instructions (Signed)
[facility] Office Phone Number [phone]  BREAST BIOPSY/ PARTIAL MASTECTOMY: POST OP INSTRUCTIONS  Always review your discharge instruction sheet given to you by the facility where your surgery was performed.  IF YOU HAVE DISABILITY OR FAMILY LEAVE FORMS, YOU MUST BRING THEM TO THE OFFICE FOR PROCESSING.  DO NOT GIVE THEM TO YOUR DOCTOR.  1. A prescription for pain medication may be given to you upon discharge.  Take your pain medication as prescribed, if needed.  If narcotic pain medicine is not needed, then you may take acetaminophen (Tylenol) or ibuprofen (Advil) as needed. 2. Take your usually prescribed medications unless otherwise directed 3. If you need a refill on your pain medication, please contact your pharmacy.  They will contact our office to request authorization.  Prescriptions will not be filled after 5pm or on week-ends. 4. You should eat very light the first 24 hours after surgery, such as soup, crackers, pudding, etc.  Resume your normal diet the day after surgery. 5. Most patients will experience some swelling and bruising in the breast.  Ice packs and a good support bra will help.  Swelling and bruising can take several days to resolve.  6. It is common to experience some constipation if taking pain medication after surgery.  Increasing fluid intake and taking a stool softener will usually help or prevent this problem from occurring.  A mild laxative (Milk of Magnesia or Miralax) should be taken according to package directions if there are no bowel movements after 48 hours. 7. Unless discharge instructions indicate otherwise, you may remove your bandages 24-48 hours after surgery, and you may shower at that time.  You may have steri-strips (small skin tapes) in place directly over the incision.  These strips should be left on the skin for 7-10 days.  If your surgeon used skin glue on the incision, you may shower in 24 hours.  The glue will flake off over the  next 2-3 weeks.  Any sutures or staples will be removed at the office during your follow-up visit. 8. ACTIVITIES:  You may resume regular daily activities (gradually increasing) beginning the next day.  Wearing a good support bra or sports bra minimizes pain and swelling.  You may have sexual intercourse when it is comfortable. a. You may drive when you no longer are taking prescription pain medication, you can comfortably wear a seatbelt, and you can safely maneuver your car and apply brakes. b. RETURN TO WORK:  Monday April 26th c. ___________________________________________________________________________________ 9. You should see your doctor in the office for a follow-up appointment approximately two weeks after your surgery.  Your doctor's nurse will typically make your follow-up appointment when she calls you with your pathology report.  Expect your pathology report 2-3 business days after your surgery.  You may call to check if you do not hear from [place] after three days. 10. OTHER INSTRUCTIONS:OK TO SHOWER STARTING TOMORROW 11. ICE PACK, TYLENOL, AND IBUPROFEN ALSO FOR PAIN 12. NO VIGOROUS ACTIVITY FOR ONE WEEK _______________________________________________________________________________________________ _____________________________________________________________________________________________________________________________________ _____________________________________________________________________________________________________________________________________ _____________________________________________________________________________________________________________________________________  WHEN TO CALL YOUR DOCTOR: 1. Fever over 101.0 2. Nausea and/or vomiting. 3. Extreme swelling or bruising. 4. Continued bleeding from incision. 5. Increased pain, redness, or drainage from the incision.  The clinic staff is available to answer your questions during regular business hours.  Please  don't hesitate to call and ask to speak to one of the nurses for clinical concerns.  If you have a medical emergency, go to the nearest emergency room or  call 911.  A surgeon from [facility] is always on call at the hospital.     To whom it may concern:  Ana Walker underwent a surgical procedure on 01/13/2020.  She may return to work on 01/19/2000  Thank you,  Abigail Miyamoto, MD For further questions, please visit [email or web address]

## 2020-01-13 NOTE — Anesthesia Procedure Notes (Signed)
Procedure Name: LMA Insertion Date/Time: 01/13/2020 1:02 PM Performed by: De Nurse, CRNA Pre-anesthesia Checklist: Patient identified, Emergency Drugs available, Suction available and Patient being monitored Patient Re-evaluated:Patient Re-evaluated prior to induction Oxygen Delivery Method: Circle System Utilized Preoxygenation: Pre-oxygenation with 100% oxygen Induction Type: IV induction Ventilation: Mask ventilation without difficulty LMA: LMA inserted LMA Size: 5.0 Number of attempts: 1 Placement Confirmation: positive ETCO2 Tube secured with: Tape Dental Injury: Teeth and Oropharynx as per pre-operative assessment

## 2020-01-14 LAB — SURGICAL PATHOLOGY

## 2020-01-19 ENCOUNTER — Encounter: Payer: Self-pay | Admitting: Anesthesiology

## 2020-01-19 NOTE — Anesthesia Postprocedure Evaluation (Signed)
Anesthesia Post Note  Patient: Ana Walker  Procedure(s) Performed: [name] (Right Breast)     Patient location during evaluation: PACU Anesthesia Type: General Level of consciousness: awake and alert Pain management: pain level controlled Vital Signs Assessment: post-procedure vital signs reviewed and stable Respiratory status: spontaneous breathing, nonlabored ventilation, respiratory function stable and patient connected to nasal cannula oxygen Cardiovascular status: blood pressure returned to baseline and stable Postop Assessment: no apparent nausea or vomiting Anesthetic complications: no    Last Vitals:  Vitals:   01/13/20 1340 01/13/20 1355  BP: (!) 148/87 136/84  Pulse: 83 74  Resp: 15 18  Temp: 36.4 C   SpO2: 100% 98%    Last Pain:  Vitals:   01/13/20 1340  TempSrc:   PainSc: 0-No pain                 Pellegrino Kennard

## 2020-07-14 ENCOUNTER — Encounter (HOSPITAL_COMMUNITY): Payer: Self-pay

## 2020-09-27 DIAGNOSIS — Z1329 Encounter for screening for other suspected endocrine disorder: Secondary | ICD-10-CM | POA: Diagnosis not present

## 2020-09-27 DIAGNOSIS — Z0000 Encounter for general adult medical examination without abnormal findings: Secondary | ICD-10-CM | POA: Diagnosis not present

## 2020-09-27 DIAGNOSIS — Z20828 Contact with and (suspected) exposure to other viral communicable diseases: Secondary | ICD-10-CM | POA: Diagnosis not present

## 2020-09-27 DIAGNOSIS — Z13220 Encounter for screening for lipoid disorders: Secondary | ICD-10-CM | POA: Diagnosis not present

## 2020-10-06 DIAGNOSIS — R69 Illness, unspecified: Secondary | ICD-10-CM | POA: Diagnosis not present

## 2020-10-06 DIAGNOSIS — L659 Nonscarring hair loss, unspecified: Secondary | ICD-10-CM | POA: Diagnosis not present

## 2020-10-06 DIAGNOSIS — Z8616 Personal history of COVID-19: Secondary | ICD-10-CM | POA: Diagnosis not present

## 2020-10-06 DIAGNOSIS — R739 Hyperglycemia, unspecified: Secondary | ICD-10-CM | POA: Diagnosis not present

## 2020-10-06 DIAGNOSIS — G4730 Sleep apnea, unspecified: Secondary | ICD-10-CM | POA: Diagnosis not present

## 2020-10-06 DIAGNOSIS — Z6841 Body Mass Index (BMI) 40.0 and over, adult: Secondary | ICD-10-CM | POA: Diagnosis not present

## 2020-11-05 DIAGNOSIS — J019 Acute sinusitis, unspecified: Secondary | ICD-10-CM | POA: Diagnosis not present

## 2021-01-11 ENCOUNTER — Other Ambulatory Visit: Payer: Self-pay | Admitting: Physician Assistant

## 2021-01-11 DIAGNOSIS — Z1231 Encounter for screening mammogram for malignant neoplasm of breast: Secondary | ICD-10-CM

## 2021-01-12 ENCOUNTER — Ambulatory Visit (INDEPENDENT_AMBULATORY_CARE_PROVIDER_SITE_OTHER): Payer: BC Managed Care – PPO | Admitting: Psychiatry

## 2021-01-12 ENCOUNTER — Other Ambulatory Visit: Payer: Self-pay

## 2021-01-12 DIAGNOSIS — F411 Generalized anxiety disorder: Secondary | ICD-10-CM
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
## 2021-01-31 ENCOUNTER — Ambulatory Visit (INDEPENDENT_AMBULATORY_CARE_PROVIDER_SITE_OTHER): Payer: BC Managed Care – PPO | Admitting: Psychiatry

## 2021-01-31 ENCOUNTER — Other Ambulatory Visit: Payer: Self-pay

## 2021-01-31 DIAGNOSIS — F411 Generalized anxiety disorder: Secondary | ICD-10-CM | POA: Diagnosis not present
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
## 2021-02-11 ENCOUNTER — Emergency Department (HOSPITAL_COMMUNITY): Payer: BC Managed Care – PPO

## 2021-02-11 ENCOUNTER — Encounter (HOSPITAL_COMMUNITY): Payer: Self-pay

## 2021-02-11 ENCOUNTER — Emergency Department (HOSPITAL_COMMUNITY)
Admission: EM | Admit: 2021-02-11 | Discharge: 2021-02-11 | Disposition: A | Discharge status: Home / Self Care | Payer: BC Managed Care – PPO | Attending: Emergency Medicine | Admitting: Emergency Medicine

## 2021-02-11 ENCOUNTER — Other Ambulatory Visit: Payer: Self-pay

## 2021-02-11 DIAGNOSIS — K219 Gastro-esophageal reflux disease without esophagitis: Secondary | ICD-10-CM | POA: Diagnosis not present

## 2021-02-11 DIAGNOSIS — R079 Chest pain, unspecified: Secondary | ICD-10-CM

## 2021-02-11 DIAGNOSIS — Z20822 Contact with and (suspected) exposure to covid-19: Secondary | ICD-10-CM | POA: Diagnosis not present

## 2021-02-11 DIAGNOSIS — R0789 Other chest pain: Secondary | ICD-10-CM | POA: Diagnosis present

## 2021-02-11 LAB — CBC
HCT: 37.7 % (ref 36.0–46.0)
Hemoglobin: 11.7 g/dL — ABNORMAL LOW (ref 12.0–15.0)
MCH: 28.4 pg (ref 26.0–34.0)
MCHC: 31 g/dL (ref 30.0–36.0)
MCV: 91.5 fL (ref 80.0–100.0)
Platelets: 222 10*3/uL (ref 150–400)
RBC: 4.12 MIL/uL (ref 3.87–5.11)
RDW: 14.3 % (ref 11.5–15.5)
WBC: 8.9 10*3/uL (ref 4.0–10.5)
nRBC: 0 % (ref 0.0–0.2)

## 2021-02-11 LAB — BASIC METABOLIC PANEL
Anion gap: 10 (ref 5–15)
BUN: 13 mg/dL (ref 6–20)
CO2: 25 mmol/L (ref 22–32)
Calcium: 9.2 mg/dL (ref 8.9–10.3)
Chloride: 101 mmol/L (ref 98–111)
Creatinine, Ser: 0.81 mg/dL (ref 0.44–1.00)
GFR, Estimated: >60 mL/min (ref >60)
Glucose, Bld: 216 mg/dL — ABNORMAL HIGH (ref 70–99)
Potassium: 4.1 mmol/L (ref 3.5–5.1)
Sodium: 136 mmol/L (ref 135–145)

## 2021-02-11 LAB — TROPONIN I (HIGH SENSITIVITY)
Troponin I (High Sensitivity): <2 ng/L (ref <18)
Troponin I (High Sensitivity): <2 ng/L (ref <18)

## 2021-02-11 LAB — I-STAT BETA HCG BLOOD, ED (MC, WL, AP ONLY): I-stat hCG, quantitative: <5 m[IU]/mL (ref <5)

## 2021-02-11 LAB — SARS CORONAVIRUS 2 (TAT 6-24 HRS): SARS Coronavirus 2: NEGATIVE

## 2021-02-11 MED ORDER — PANTOPRAZOLE SODIUM 40 MG PO TBEC
40.0000 mg | DELAYED_RELEASE_TABLET | Freq: Once | ORAL | Status: AC
  Administered 2021-02-11: 40 mg via ORAL
  Filled 2021-02-11: qty 1

## 2021-02-11 MED ORDER — PANTOPRAZOLE SODIUM 20 MG PO TBEC: 20.0000 mg | DELAYED_RELEASE_TABLET | Freq: Every day | ORAL | 0 refills | Status: DC

## 2021-02-11 NOTE — ED Triage Notes (Signed)
Pt here c/o chest pain, fatigue and SOB that started last night. Vitals are stable.
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
## 2021-02-11 NOTE — Discharge Instructions (Addendum)
Take Protonix as directed.  Your troponins for your heart were negative. Follow-up closely with cardiology as discussed.  Return the ER for new or worsening signs or symptoms.  You can use Tylenol every 4 hours as needed for pain.

## 2021-02-15 ENCOUNTER — Emergency Department (HOSPITAL_COMMUNITY): Payer: BC Managed Care – PPO

## 2021-02-15 ENCOUNTER — Emergency Department (HOSPITAL_COMMUNITY)
Admission: EM | Admit: 2021-02-15 | Discharge: 2021-02-15 | Disposition: A | Discharge status: Home / Self Care | Payer: BC Managed Care – PPO | Attending: Emergency Medicine | Admitting: Emergency Medicine

## 2021-02-15 ENCOUNTER — Other Ambulatory Visit: Payer: Self-pay

## 2021-02-15 DIAGNOSIS — R0789 Other chest pain: Secondary | ICD-10-CM | POA: Insufficient documentation

## 2021-02-15 DIAGNOSIS — R0602 Shortness of breath: Secondary | ICD-10-CM | POA: Diagnosis not present

## 2021-02-15 DIAGNOSIS — R5383 Other fatigue: Secondary | ICD-10-CM | POA: Insufficient documentation

## 2021-02-15 DIAGNOSIS — R059 Cough, unspecified: Secondary | ICD-10-CM | POA: Diagnosis not present

## 2021-02-15 LAB — BASIC METABOLIC PANEL
Anion gap: 7 (ref 5–15)
BUN: 9 mg/dL (ref 6–20)
CO2: 24 mmol/L (ref 22–32)
Calcium: 8.8 mg/dL — ABNORMAL LOW (ref 8.9–10.3)
Chloride: 105 mmol/L (ref 98–111)
Creatinine, Ser: 0.73 mg/dL (ref 0.44–1.00)
GFR, Estimated: >60 mL/min (ref >60)
Glucose, Bld: 187 mg/dL — ABNORMAL HIGH (ref 70–99)
Potassium: 3.8 mmol/L (ref 3.5–5.1)
Sodium: 136 mmol/L (ref 135–145)

## 2021-02-15 LAB — CBC WITH DIFFERENTIAL/PLATELET
Abs Immature Granulocytes: 0.02 10*3/uL (ref 0.00–0.07)
Basophils Absolute: 0 10*3/uL (ref 0.0–0.1)
Basophils Relative: 1 %
Eosinophils Absolute: 0.1 10*3/uL (ref 0.0–0.5)
Eosinophils Relative: 1 %
HCT: 36.6 % (ref 36.0–46.0)
Hemoglobin: 11.4 g/dL — ABNORMAL LOW (ref 12.0–15.0)
Immature Granulocytes: 0 %
Lymphocytes Relative: 18 %
Lymphs Abs: 1.1 10*3/uL (ref 0.7–4.0)
MCH: 28.7 pg (ref 26.0–34.0)
MCHC: 31.1 g/dL (ref 30.0–36.0)
MCV: 92.2 fL (ref 80.0–100.0)
Monocytes Absolute: 0.7 10*3/uL (ref 0.1–1.0)
Monocytes Relative: 10 %
Neutro Abs: 4.5 10*3/uL (ref 1.7–7.7)
Neutrophils Relative %: 70 %
Platelets: 234 10*3/uL (ref 150–400)
RBC: 3.97 MIL/uL (ref 3.87–5.11)
RDW: 14.3 % (ref 11.5–15.5)
WBC: 6.5 10*3/uL (ref 4.0–10.5)
nRBC: 0 % (ref 0.0–0.2)

## 2021-02-15 LAB — I-STAT BETA HCG BLOOD, ED (MC, WL, AP ONLY): I-stat hCG, quantitative: <5 m[IU]/mL (ref <5)

## 2021-02-15 MED ORDER — ALBUTEROL SULFATE HFA 108 (90 BASE) MCG/ACT IN AERS: 2.0000 | INHALATION_SPRAY | RESPIRATORY_TRACT | Status: DC | PRN
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
## 2021-02-15 NOTE — Discharge Instructions (Signed)
As we discussed today, your chest x-ray is normal. Your blood work is also reassuring. Monitor your oxygen percentage at home, if you become hypoxic with oxygen saturation less than 90%, or if your symptoms significantly worsen, please return to the emergency department for re-evaluation. Follow closely with your primary care provider regarding your ongoing symptoms and ED visit today.

## 2021-02-15 NOTE — ED Notes (Signed)
Pt's O2 >97% while ambulating. Pt reports increased shortness of breath while ambulating.

## 2021-02-15 NOTE — ED Triage Notes (Signed)
Pt reports SOB that began this morning. Pt had complete workup last Friday for cardiac issues that was negative. Pt reports CPAP filter and mask are black. Pt speaking in full sentences. No acute distress noted.

## 2021-03-01 ENCOUNTER — Other Ambulatory Visit: Payer: Self-pay

## 2021-03-01 ENCOUNTER — Ambulatory Visit (INDEPENDENT_AMBULATORY_CARE_PROVIDER_SITE_OTHER): Payer: BC Managed Care – PPO | Admitting: Psychiatry

## 2021-03-01 ENCOUNTER — Ambulatory Visit
Admission: RE | Admit: 2021-03-01 | Discharge: 2021-03-01 | Disposition: A | Discharge status: Home / Self Care | Payer: BC Managed Care – PPO | Source: Ambulatory Visit | Attending: Physician Assistant | Admitting: Physician Assistant

## 2021-03-01 DIAGNOSIS — F411 Generalized anxiety disorder: Secondary | ICD-10-CM

## 2021-03-01 DIAGNOSIS — Z1231 Encounter for screening mammogram for malignant neoplasm of breast: Secondary | ICD-10-CM

## 2021-03-01 NOTE — Progress Notes (Signed)
[facility] Counselor/Therapist Progress Note  Patient ID: Ana Walker, MRN: [id number],    Date: 03/01/2021  Time Spent: 60 minutes  Treatment Type: Individual Therapy  Reported Symptoms: depression, "high anxiety", fatigue, fearful thoughts  Mental Status Exam:  Appearance:   Casual     Behavior:  Appropriate, Sharing and Motivated  Motor:  Normal  Speech/Language:   Clear and Coherent  Affect:  Flat and anxious, depressed  Mood:  anxious, depressed and sad  Thought process:  goal directed  Thought content:    some obsessiveness  Sensory/Perceptual disturbances:    WNL  Orientation:  oriented to person, place, time/date, situation, day of week, month of year and year  Attention:  Fair  Concentration:  Poor  Memory:  WNL  Fund of knowledge:   Good  Insight:    Good  Judgment:   Good  Impulse Control:  Good   Risk Assessment: Danger to Self:  No Self-injurious Behavior: No Danger to Others: No Duty to Warn:no Physical Aggression / Violence:No  Access to Firearms a concern: No  Gang Involvement:No   Subjective:  Patient in today reporting "high anxiety, depression, fatigue, some sleep problems, fearful thoughts" and am so stressed today about what's been going on for me."  Explains she had multiple physical concerns and was seen for several symptoms including breathing issues. Very upset, anxious, and fearful as they found a lot of black mold in her airducts at home and she feels that's led to her physical issues. Has secured another apt and to move approx June 20th, and her current apt has been cleaned and treated due to the black mold. Processed her anxieties and fears, and eventually her next steps in moving and following up on med appts. Reports that her work has also been quite stressful with her illness and other workers being out of work. States she realizes her need also to be taking better care of herself in getting more exercise and creating healthier  eating patterns including refraining from using food for comfort. See Progress/Plan below.  Interventions: Solution-Oriented/Positive Psychology, Ego-Supportive and Insight-Oriented  Diagnosis:   ICD-10-CM   1. Generalized anxiety disorder  F41.1     Treatment goal plan:  Patient not signing treatment plan on computer screen due to COVID. Treatment goal plan: Treatment goals remain on treatment plan as patient works with strategies to achieve her goals. Progress is noted each session and documented in the "progress" section of note. Long term goal: Reduce overall level, frequency, and intensity of the anxiety so that daily functioning is not impaired. Short term goal: Increase understanding of beliefs and messages that produce worry and anxiety. Strategies: Identify, challenge, and replace anxious/fearful self talk with positive, realistic, and empowering self talk.   Progress/plan: Patient showing good motivation in session today. Did good job in working through some of her fears and anxieties re: recent black mold in apt and physical issues for patient as a result.  Is showing some resilience and looking at her needs in order of priorities, with her health being #1.  States that she is really wanting to have more the strength and willpower to make better decisions for her overall health including not using food for comfort, and getting back into some sort of exercise routine.  Encouraged patient to practice some of the things that have been helpful to her before including: Deep breathing exercises that help her anxiety as we discussed in prior session, limit her caffeine intake,  meditation, healthy eating choices, getting outside daily and walking, reading inspirational books, music and prayers, good sleep habits, pause and reset, and exercise including yoga/walking/weights all of which patient has found to be helpful when she was exercising previously.  Goal review and progress/challenges  noted with patient.  Next appointment within 2-3 weeks.   Mathis Fare, LCSW

## 2021-03-02 ENCOUNTER — Other Ambulatory Visit: Payer: Self-pay | Admitting: Physician Assistant

## 2021-03-02 DIAGNOSIS — N63 Unspecified lump in unspecified breast: Secondary | ICD-10-CM

## 2021-03-16 ENCOUNTER — Ambulatory Visit: Payer: BC Managed Care – PPO | Admitting: Psychiatry

## 2021-03-17 ENCOUNTER — Other Ambulatory Visit: Payer: Self-pay

## 2021-03-17 ENCOUNTER — Ambulatory Visit: Payer: BC Managed Care – PPO

## 2021-03-17 ENCOUNTER — Ambulatory Visit
Admission: RE | Admit: 2021-03-17 | Discharge: 2021-03-17 | Disposition: A | Discharge status: Home / Self Care | Payer: BC Managed Care – PPO | Source: Ambulatory Visit | Attending: Physician Assistant | Admitting: Physician Assistant

## 2021-03-17 DIAGNOSIS — N63 Unspecified lump in unspecified breast: Secondary | ICD-10-CM

## 2021-03-31 ENCOUNTER — Ambulatory Visit: Payer: BC Managed Care – PPO | Admitting: Psychiatry

## 2021-04-14 ENCOUNTER — Ambulatory Visit (INDEPENDENT_AMBULATORY_CARE_PROVIDER_SITE_OTHER): Payer: BC Managed Care – PPO | Admitting: Psychiatry

## 2021-04-14 DIAGNOSIS — F411 Generalized anxiety disorder: Secondary | ICD-10-CM | POA: Diagnosis not present
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
## 2021-04-28 ENCOUNTER — Ambulatory Visit (INDEPENDENT_AMBULATORY_CARE_PROVIDER_SITE_OTHER): Payer: BC Managed Care – PPO | Admitting: Pulmonary Disease

## 2021-04-28 ENCOUNTER — Ambulatory Visit (INDEPENDENT_AMBULATORY_CARE_PROVIDER_SITE_OTHER): Payer: BC Managed Care – PPO

## 2021-04-28 ENCOUNTER — Other Ambulatory Visit: Payer: Self-pay

## 2021-04-28 ENCOUNTER — Encounter: Payer: Self-pay | Admitting: Pulmonary Disease

## 2021-04-28 VITALS — BP 132/74 | HR 115 | Temp 97.3°F | Ht 63.5 in | Wt 331.4 lb

## 2021-04-28 DIAGNOSIS — R0602 Shortness of breath: Secondary | ICD-10-CM

## 2021-04-28 MED ORDER — BUDESONIDE-FORMOTEROL FUMARATE 80-4.5 MCG/ACT IN AERO: 2.0000 | INHALATION_SPRAY | Freq: Two times a day (BID) | RESPIRATORY_TRACT | 3 refills | Status: DC | PRN
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
## 2021-04-28 NOTE — Patient Instructions (Signed)
Obtain CBC with differential IgE levels  Chest x-ray-two-view  Schedule pulmonary function test  Follow-up in 3 to 4 weeks  Symbicort 80, 2 puffs to be used as needed

## 2021-04-29 LAB — CBC WITH DIFFERENTIAL/PLATELET
Basophils Absolute: 0.1 10*3/uL (ref 0.0–0.1)
Basophils Relative: 0.6 % (ref 0.0–3.0)
Eosinophils Absolute: 0.1 10*3/uL (ref 0.0–0.7)
Eosinophils Relative: 1.1 % (ref 0.0–5.0)
HCT: 38.2 % (ref 36.0–46.0)
Hemoglobin: 12.3 g/dL (ref 12.0–15.0)
Lymphocytes Relative: 12.6 % (ref 12.0–46.0)
Lymphs Abs: 1.4 10*3/uL (ref 0.7–4.0)
MCHC: 32.1 g/dL (ref 30.0–36.0)
MCV: 91.6 fl (ref 78.0–100.0)
Monocytes Absolute: 0.8 10*3/uL (ref 0.1–1.0)
Monocytes Relative: 7.2 % (ref 3.0–12.0)
Neutro Abs: 8.5 10*3/uL — ABNORMAL HIGH (ref 1.4–7.7)
Neutrophils Relative %: 78.5 % — ABNORMAL HIGH (ref 43.0–77.0)
Platelets: 277 10*3/uL (ref 150.0–400.0)
RBC: 4.17 Mil/uL (ref 3.87–5.11)
RDW: 15.6 % — ABNORMAL HIGH (ref 11.5–15.5)
WBC: 10.8 10*3/uL — ABNORMAL HIGH (ref 4.0–10.5)

## 2021-04-29 LAB — IGE: IgE (Immunoglobulin E), Serum: 11 kU/L (ref <=114)

## 2021-04-29 NOTE — Telephone Encounter (Signed)
Duplicate emails from patient. Will close this encounter.

## 2021-05-04 NOTE — Telephone Encounter (Signed)
Blood count is not of concern

## 2021-06-07 ENCOUNTER — Ambulatory Visit: Payer: BC Managed Care – PPO | Admitting: Pulmonary Disease

## 2021-06-15 ENCOUNTER — Ambulatory Visit: Payer: BC Managed Care – PPO | Admitting: Psychiatry

## 2021-07-21 ENCOUNTER — Encounter (HOSPITAL_COMMUNITY): Payer: Self-pay | Admitting: *Deleted

## 2021-11-17 ENCOUNTER — Other Ambulatory Visit: Payer: Self-pay

## 2021-11-17 ENCOUNTER — Emergency Department (HOSPITAL_COMMUNITY): Payer: Self-pay

## 2021-11-17 ENCOUNTER — Emergency Department (HOSPITAL_COMMUNITY)
Admission: EM | Admit: 2021-11-17 | Discharge: 2021-11-17 | Disposition: A | Discharge status: Home / Self Care | Payer: Self-pay | Attending: Emergency Medicine | Admitting: Emergency Medicine

## 2021-11-17 ENCOUNTER — Encounter (HOSPITAL_COMMUNITY): Payer: Self-pay | Admitting: Emergency Medicine

## 2021-11-17 DIAGNOSIS — M5416 Radiculopathy, lumbar region: Secondary | ICD-10-CM | POA: Insufficient documentation

## 2021-11-17 DIAGNOSIS — R109 Unspecified abdominal pain: Secondary | ICD-10-CM

## 2021-11-17 DIAGNOSIS — Z7951 Long term (current) use of inhaled steroids: Secondary | ICD-10-CM | POA: Insufficient documentation

## 2021-11-17 LAB — URINALYSIS, ROUTINE W REFLEX MICROSCOPIC
Bacteria, UA: NONE SEEN
Bilirubin Urine: NEGATIVE
Glucose, UA: NEGATIVE mg/dL
Ketones, ur: NEGATIVE mg/dL
Nitrite: NEGATIVE
Protein, ur: NEGATIVE mg/dL
Specific Gravity, Urine: 1.024 (ref 1.005–1.030)
pH: 5 (ref 5.0–8.0)

## 2021-11-17 LAB — COMPREHENSIVE METABOLIC PANEL
ALT: 32 U/L (ref 0–44)
AST: 34 U/L (ref 15–41)
Albumin: 3.6 g/dL (ref 3.5–5.0)
Alkaline Phosphatase: 82 U/L (ref 38–126)
Anion gap: 10 (ref 5–15)
BUN: 14 mg/dL (ref 6–20)
CO2: 26 mmol/L (ref 22–32)
Calcium: 8.9 mg/dL (ref 8.9–10.3)
Chloride: 102 mmol/L (ref 98–111)
Creatinine, Ser: 0.84 mg/dL (ref 0.44–1.00)
GFR, Estimated: >60 mL/min (ref >60)
Glucose, Bld: 140 mg/dL — ABNORMAL HIGH (ref 70–99)
Potassium: 4.3 mmol/L (ref 3.5–5.1)
Sodium: 138 mmol/L (ref 135–145)
Total Bilirubin: 0.8 mg/dL (ref 0.3–1.2)
Total Protein: 7.2 g/dL (ref 6.5–8.1)

## 2021-11-17 LAB — CBC
HCT: 37.6 % (ref 36.0–46.0)
Hemoglobin: 12.4 g/dL (ref 12.0–15.0)
MCH: 30.8 pg (ref 26.0–34.0)
MCHC: 33 g/dL (ref 30.0–36.0)
MCV: 93.3 fL (ref 80.0–100.0)
Platelets: 232 10*3/uL (ref 150–400)
RBC: 4.03 MIL/uL (ref 3.87–5.11)
RDW: 13.2 % (ref 11.5–15.5)
WBC: 6.9 10*3/uL (ref 4.0–10.5)
nRBC: 0 % (ref 0.0–0.2)

## 2021-11-17 LAB — I-STAT BETA HCG BLOOD, ED (MC, WL, AP ONLY): I-stat hCG, quantitative: <5 m[IU]/mL (ref <5)

## 2021-11-17 LAB — LIPASE, BLOOD: Lipase: 23 U/L (ref 11–51)

## 2021-11-17 MED ORDER — ONDANSETRON HCL 4 MG/2ML IJ SOLN
4.0000 mg | Freq: Once | INTRAMUSCULAR | Status: AC
  Administered 2021-11-17: 4 mg via INTRAVENOUS
  Filled 2021-11-17: qty 2

## 2021-11-17 MED ORDER — CYCLOBENZAPRINE HCL 10 MG PO TABS: 10.0000 mg | ORAL_TABLET | Freq: Two times a day (BID) | ORAL | 0 refills | Status: DC | PRN

## 2021-11-17 MED ORDER — MORPHINE SULFATE (PF) 4 MG/ML IV SOLN
4.0000 mg | Freq: Once | INTRAVENOUS | Status: AC
  Administered 2021-11-17: 4 mg via INTRAVENOUS
  Filled 2021-11-17: qty 1

## 2021-11-17 NOTE — Discharge Instructions (Addendum)
It would be good to get back in the pool but you also may need physical therapy.  Avoid any ibuprofen products because of the gastric sleeve.  It is okay to use Tylenol extra strength every 6 hours and you are also given a prescription for a muscle relaxer.  Sometimes heat or ice can be helpful as well.  Getting up and continuing to move will help it improve faster and will get worse if you lay in bed and do not do anything.

## 2021-11-17 NOTE — ED Provider Notes (Signed)
[facility] EMERGENCY DEPARTMENT Provider Note   CSN: [id number] Arrival date & time: 11/17/21  [id number]     History  Chief Complaint  Patient presents with   Abdominal Pain   Flank Pain    Ana Walker is a 46 y.o. female.  Patient is a 46 year old female with a history of prior kidney stone a few months ago, sleep apnea and anxiety who is presenting today with 10 days of persistently worsening left-sided pain.  Patient reports that the pain has been gradually worsening.  Initially it was just in the left side and it would wax and wane in severity.  She had no other associated symptoms with it however it is gradually the pain has been getting worse and then she reported she worked last night and the pain started radiating into her left flank and down lower into her groin.  It is not uncomfortable for her to urinate but she does report any movement seems to make it worse and she also felt like there may be even some mild tingling in her foot today.  She had her menses last week but reports that that has finished and initially she was just thinking it might be an ovarian cyst.  However things are not improving.  She has had no nausea, vomiting or fever.  She was taking some leftover Flomax and hydrocodone at home when the pain was not going away but reported she was just taking Tylenol last night and thinks that might be another reason why the pain became worse.  She has never had to have urologic intervention.  The history is provided by the patient and medical records.  Abdominal Pain Flank Pain Associated symptoms include abdominal pain.      Home Medications Prior to Admission medications   Medication Sig Start Date End Date Taking? Authorizing Provider  cyclobenzaprine (FLEXERIL) 10 MG tablet Take 1 tablet (10 mg total) by mouth 2 (two) times daily as needed for muscle spasms. 11/17/21  Yes Hasten Sweitzer, Loree Fee, MD  albuterol (VENTOLIN HFA) 108 (90 Base) MCG/ACT  inhaler Inhale 2 puffs into the lungs every 4 (four) hours as needed. 03/31/21   [provider]  ALPRAZolam Duanne Moron) 1 MG tablet Take 1 mg by mouth 2 (two) times daily as needed for anxiety.     [provider]  Ascorbic Acid (VITAMIN C PO) Take 1 tablet by mouth daily.    [provider]  BIOTIN PO Take 1 tablet by mouth daily.     [provider]  budesonide-formoterol (SYMBICORT) 80-4.5 MCG/ACT inhaler Inhale 2 puffs into the lungs every 12 (twelve) hours as needed. 04/28/21   Olalere, Cicero Duck A, MD  FLUoxetine (PROZAC) 40 MG capsule Take 40 mg by mouth every morning.     [provider]  fluticasone (FLONASE) 50 MCG/ACT nasal spray Place 2 sprays into both nostrils daily as needed for allergies.  12/11/18   [provider]  montelukast (SINGULAIR) 10 MG tablet Take 10 mg by mouth daily.    [provider]  Multiple Vitamins-Minerals (BARIATRIC MULTIVITAMINS/IRON PO) Take 1 tablet by mouth daily.    [provider]      Allergies    Patient has no known allergies.    Review of Systems   Review of Systems  Gastrointestinal:  Positive for abdominal pain.  Genitourinary:  Positive for flank pain.   Physical Exam Updated Vital Signs BP 134/77 (BP Location: Right Arm)    Pulse 69  Temp 98.5 F (36.9 C) (Oral)    Resp 15    Ht 5' 3.5" (1.613 m)    Wt (!) 150.1 kg    SpO2 94%    BMI 57.71 kg/m  Physical Exam Vitals and nursing note reviewed.  Constitutional:      General: She is not in acute distress.    Appearance: She is well-developed.  HENT:     Head: Normocephalic and atraumatic.  Eyes:     Pupils: Pupils are equal, round, and reactive to light.  Cardiovascular:     Rate and Rhythm: Normal rate and regular rhythm.     Pulses: Normal pulses.     Heart sounds: Normal heart sounds. No murmur heard.   No friction rub.  Pulmonary:     Effort: Pulmonary effort is normal.     Breath sounds: Normal breath sounds. No  wheezing or rales.  Abdominal:     General: Bowel sounds are normal. There is no distension.     Palpations: Abdomen is soft.     Tenderness: There is abdominal tenderness in the left lower quadrant. There is left CVA tenderness. There is no guarding or rebound.  Musculoskeletal:        General: No tenderness. Normal range of motion.     Comments: No edema  Skin:    General: Skin is warm and dry.     Findings: No rash.  Neurological:     Mental Status: She is alert and oriented to person, place, and time. Mental status is at baseline.     Cranial Nerves: No cranial nerve deficit.  Psychiatric:        Behavior: Behavior normal.    ED Results / Procedures / Treatments   Labs (all labs ordered are listed, but only abnormal results are displayed) Labs Reviewed  COMPREHENSIVE METABOLIC PANEL - Abnormal; Notable for the following components:      Result Value   Glucose, Bld 140 (*)    All other components within normal limits  URINALYSIS, ROUTINE W REFLEX MICROSCOPIC - Abnormal; Notable for the following components:   Color, Urine AMBER (*)    Hgb urine dipstick LARGE (*)    Leukocytes,Ua SMALL (*)    All other components within normal limits  LIPASE, BLOOD  CBC  I-STAT BETA HCG BLOOD, ED ([facility], [facility], [facility] ONLY)    EKG None  Radiology CT Renal Stone Study  Result Date: 11/17/2021 CLINICAL DATA:  Left flank pain. EXAM: CT ABDOMEN AND PELVIS WITHOUT CONTRAST TECHNIQUE: Multidetector CT imaging of the abdomen and pelvis was performed following the standard protocol without IV contrast. RADIATION DOSE REDUCTION: This exam was performed according to the departmental dose-optimization program which includes automated exposure control, adjustment of the mA and/or kV according to patient size and/or use of iterative reconstruction technique. COMPARISON:  05/06/2021 FINDINGS: Lower chest: No acute abnormality. Hepatobiliary: Diffuse low attenuation of the liver as can be seen with hepatic  steatosis. No gallstones, gallbladder wall thickening, or biliary dilatation. Pancreas: Unremarkable. No pancreatic ductal dilatation or surrounding inflammatory changes. Spleen: Normal in size without focal abnormality. Adrenals/Urinary Tract: Adrenal glands are unremarkable. Kidneys are normal, without renal calculi, focal lesion, or hydronephrosis. Bladder is unremarkable. Stomach/Bowel: Postsurgical changes from prior gastric sleeve procedure. Stomach is otherwise unremarkable. No evidence of bowel wall thickening, distention, or inflammatory changes. Appendix is normal. Vascular/Lymphatic: No significant vascular findings are present. No enlarged abdominal or pelvic lymph nodes. Reproductive: Normal uterus. 2.7 cm bulbous appearance of the right  ovary which may reflect underlying mass. Other: No abdominal wall hernia or abnormality. No abdominopelvic ascites. Musculoskeletal: No acute osseous abnormality. No aggressive osseous lesion. Bilateral facet arthropathy at L3-4 and L4-5. IMPRESSION: 1. No acute abdominal or pelvic pathology. 2. Hepatic steatosis. 3. 2.7 cm bulbous appearance of the right ovary which may reflect underlying mass. Recommend further evaluation with a non emergent pelvic ultrasound. Electronically Signed   By: Kathreen Devoid M.D.   On: 11/17/2021 10:52    Procedures Procedures    Medications Ordered in ED Medications  morphine (PF) 4 MG/ML injection 4 mg (4 mg Intravenous Given 11/17/21 1053)  ondansetron (ZOFRAN) injection 4 mg (4 mg Intravenous Given 11/17/21 1052)    ED Course/ Medical Decision Making/ A&P                           Medical Decision Making Amount and/or Complexity of Data Reviewed External Data Reviewed: notes. Labs: ordered. Decision-making details documented in ED Course. Radiology: ordered and independent interpretation performed. Decision-making details documented in ED Course.  Risk Prescription drug management.   Patient is a 46 year old female  presenting today with left-sided flank and lower abdominal pain.  Patient does have prior history of kidney stone a few months ago however in evaluation of external medical records patient did have left lower quadrant pain and hematuria and at that time saw a possible stone but not definitive.  Patient also had evidence of fatty liver.  She however does not have recurrent abdominal pain normally and had been fine until 10 days ago when this started.  She does have hematuria today based on my independent interpretation of her urine.  Lipase, CMP and CBC are all within normal limits.  Patient given pain control.  Imaging is pending.  11:57 AM I independently viewed and interpreted renal CT without evidence of hydronephrosis or notable renal stones.  He reported no acute abdominal or pelvic pathology, hepatic steatosis and a 2.7 cm bulbous appearance of the right ovary which would need ultrasound in the future as well as bilateral facet arthropathy at L3-4 and L4 and 5.  Findings discussed with the patient.  Currently today all of her pain is on the left side and not related to her right ovary.  She will follow-up with her PCP about this and get an ultrasound.  Discussed with her also concern that symptoms may be radicular from the lumbar spine in nature as she is having some tingling in her left foot as well.  Her pulses intact and strength is normal.  This is very similar to an episode she had over a month ago and at that time it was not clear that she actually had a renal stone.  Discussed with her that she should follow-up with her PCP.  She needs to get back in the pool and may need further for physical therapy.  Caution patient about not using NSAIDs as she has had a gastric sleeve.  She will use Tylenol and muscle relaxer as needed. No need for admission at this time.  Pt d/ced is good condition.         Final Clinical Impression(s) / ED Diagnoses Final diagnoses:  Flank pain  Lumbar  radiculopathy, acute    Rx / DC Orders ED Discharge Orders          Ordered    cyclobenzaprine (FLEXERIL) 10 MG tablet  2 times daily PRN  11/17/21 Amorita, MD 11/17/21 1157

## 2021-11-17 NOTE — ED Triage Notes (Signed)
Patient c/o left lower abdominal pain radiating into her back onset of 13th. Has been treating with azo. Hx of kidney stones. States she had some left over pain meds and has been taking. C/o left foot numbness that started this am. Patient is ambulatory.

## 2022-04-21 ENCOUNTER — Other Ambulatory Visit: Payer: Self-pay | Admitting: Physician Assistant

## 2022-04-21 DIAGNOSIS — Z1231 Encounter for screening mammogram for malignant neoplasm of breast: Secondary | ICD-10-CM

## 2022-05-19 ENCOUNTER — Ambulatory Visit
Admission: RE | Admit: 2022-05-19 | Discharge: 2022-05-19 | Disposition: A | Discharge status: Home / Self Care | Payer: BC Managed Care – PPO | Source: Ambulatory Visit | Attending: Physician Assistant | Admitting: Physician Assistant

## 2022-05-19 DIAGNOSIS — Z1231 Encounter for screening mammogram for malignant neoplasm of breast: Secondary | ICD-10-CM

## 2022-05-26 ENCOUNTER — Ambulatory Visit: Payer: Medicaid Other

## 2022-07-14 ENCOUNTER — Encounter (HOSPITAL_COMMUNITY): Payer: Self-pay | Admitting: *Deleted

## 2023-03-05 ENCOUNTER — Ambulatory Visit: Payer: BC Managed Care – PPO | Admitting: Psychiatry

## 2023-03-05 DIAGNOSIS — F411 Generalized anxiety disorder: Secondary | ICD-10-CM

## 2023-03-05 NOTE — Progress Notes (Signed)
[facility] Counselor/Therapist Progress Note  Patient ID: Ana Walker, MRN: [id number],    Date: 03/05/2023  Time Spent: 55 minutes   Treatment Type: Individual Therapy  Reported Symptoms: anxiety, some depression  Mental Status Exam:  Appearance:   Casual     Behavior:  Appropriate, Sharing, and Motivated  Motor:  Normal  Speech/Language:   Clear and Coherent  Affect:  Depressed and anxious  Mood:  anxious and depressed  Thought process:  goal directed  Thought content:    WNL  Sensory/Perceptual disturbances:    WNL  Orientation:  oriented to person, place, time/date, situation, day of week, month of year, year, and stated date of March 05, 2023  Attention:  Good  Concentration:  Good  Memory:  WNL  Fund of knowledge:   Good  Insight:    Good and Fair  Judgment:   Good  Impulse Control:  Good and Fair   Risk Assessment: Danger to Self:  No Self-injurious Behavior: No Danger to Others: No Duty to Warn:no Physical Aggression / Violence:No  Access to Firearms a concern: No  Gang Involvement:No   Subjective: Patient back in treatment today due to "developments within her family" including: mom had massive stroke and on hospice care and in nursing home, adult daughter divorced and back living with patient who is needing to set limits with daughter, and patient has now been diagnosed with diabetes and on medication. 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 Reports a lot of anxiety, depression, work stressors, her own health issues, and overall family stressors but feels she can get better and have more inner strength. 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 Remains very involved in her church and feels supported by friends there. Her priorities in returning to treatment is to grow in my ability to "put myself first for a change" and not be sucked in by  negative/unhealthy family and other people. "Spent my entire life taking care of others, and not myself". Processed with patient what all this means for her and she stated "setting better limits and boundaries, better managing her anxiety, worry less, improved self-care including positive self-talk and self-care, and believe more in herself."  Interventions: Cognitive Behavioral Therapy and Solution-Oriented/Positive Psychology  Long term goal: Reduce overall level, frequency, and intensity of the anxiety so that daily functioning is not impaired. Short term goal: Increase understanding of beliefs and messages that produce worry and anxiety. Strategies: Identify, challenge, and replace anxious/fearful self talk with positive, realistic, and empowering self talk.  Diagnosis:   ICD-10-CM   1. Generalized anxiety disorder  F41.1      Plan:   Patient today showing good motivation as she shared updates in her personal, family, and work life. Feel some improved self-confidence. Insight showing some growth and still easy to slip back into old habits at times. Did well in focusing more on untangling more from some of family and trying to focus more on "myself at this point as I can't take care of everybody else." Relies on her faith a lot. Wanting to lose weight. Instead of moving away from things I am not trying to move towards what I need mentally and physically. Trying to interrupt anxious thoughts and challenge them more now than letting them build up and "feel overwhelmed". Encouraged patient in her practice of more positive behaviors that have proven  to be helpful to her before between sessions when she was being seen previously including limiting her caffeine intake, deep breathing exercises to help with anxiety, healthy eating choices, meditation, getting outside daily and walking, music and prayers, "pause and reset" technique, reading inspirational books, healthy sleep patterns, exercise, and feel  good about the strength she shows as she works with goal-directed behaviors to move forward in a more positive direction of improved overall physical and emotional health.  Self-rating scales:(to be updated next appt) 1-10 depression scale-5/6 1-10 anxiety scale-8/9 1-10 self-esteem scale-6 1-10 motivation scale-6 1-10 hopefulness scale-7  Goal review and progress/challenges noted with patient.  Next appointment within 2 to 3 weeks.   Mathis Fare, LCSW

## 2023-03-21 ENCOUNTER — Ambulatory Visit: Payer: BC Managed Care – PPO | Admitting: Psychiatry

## 2023-03-27 ENCOUNTER — Ambulatory Visit (INDEPENDENT_AMBULATORY_CARE_PROVIDER_SITE_OTHER): Payer: BC Managed Care – PPO | Admitting: Psychiatry

## 2023-03-27 DIAGNOSIS — F411 Generalized anxiety disorder: Secondary | ICD-10-CM | POA: Diagnosis not present

## 2023-03-27 NOTE — Progress Notes (Signed)
[facility] Counselor/Therapist Progress Note  Patient ID: Ana Walker, MRN: [id number],    Date: 03/27/2023  Time Spent: 53 minutes   Treatment Type: Individual Therapy  Reported Symptoms: anxiety,depression, overthinking re: family dysfunction  Mental Status Exam:  Appearance:   Casual     Behavior:  Appropriate, Sharing, and Motivated  Motor:  Normal  Speech/Language:   Clear and Coherent  Affect:  anxious  Mood:  anxious and some depression  Thought process:  goal directed  Thought content:    Rumination  Sensory/Perceptual disturbances:    WNL  Orientation:  oriented to person, place, time/date, situation, day of week, month of year, year, and stated date of March 27, 2023  Attention:  Fair  Concentration:  Good  Memory:  WNL  Fund of knowledge:   Good  Insight:    Fair  Judgment:   Good and Fair  Impulse Control:  Fair   Risk Assessment: Danger to Self:  No Self-injurious Behavior: No Danger to Others: No Duty to Warn:no Physical Aggression / Violence:No  Access to Firearms a concern: No  Gang Involvement:No   Subjective:   Patient today reporting anxiety, stressed, overthinking, working to improve insight, and needing better boundaries "everywhere". Struggling with family issues. She and brother have not spoken since Christmas of last year. States she needs to continue to recognize the "toxicity within my family" and know that boundaries are needed and "forgive myself for not recognizing it earlier." Conflict with brother. Mom currently in nursing home. Working towards having healthier boundaries that she can maintain which has been a need for a long time. Is very involved in her church and her faith and shares in session how her church is a very supportive part of her life. Focusing on more current needs of "clear boundaries with others, improved self-care and balancing my life, exercise and be healthier." States I need to have more responsibility and  discipline. States she wants to walk more. Works 3 twelve-hour shifts at work weekly. Difficulty "getting healthier" but wants to be more motivated. Set goals for herself as being "drink at least 2 glasses of water daily" and "walk at least 2 times weekly for 20 minutes each time." For this patient, she feels "these are fair goals" and relevant/achievable for her. Active in her church an feels the support she needs there. Focusing more on "having better boundaries more consistently."   Interventions: Cognitive Behavioral Therapy and Ego-Supportive  Long term goal: Reduce overall level, frequency, and intensity of the anxiety so that daily functioning is not impaired. Short term goal: Increase understanding of beliefs and messages that produce worry and anxiety. Strategies: Identify, challenge, and replace anxious/fearful self talk with positive, realistic, and empowering self talk.  Diagnosis:   ICD-10-CM   1. Generalized anxiety disorder  F41.1      Plan:  Patient in session today and is motivated and participating well as she continues working on personal, work, and family stressors.  Wanting to improve her health habits and set a couple specific goals targeting improved self-care.  Feeling motivated today per her report but acknowledges it has been hard in the past to stick with goals as "old habits are hard to change".  Recognizing her need to untangle some from negative family dynamics.  Relies on her faith daily.  Discussed this in session today and seem to be more optimistic.  Is making progress and needs to continue working with goal-directed behaviors in order to keep  moving in a forward direction. Encouraged patient in her practice of more positive behaviors including: Limiting her caffeine intake, deep breathing exercises to help with anxiety, healthier eating choices, meditation, getting outside daily and walking, use of music and prayers as she feels this is helpful, "pause and reset"  technique, reading inspirational books, healthy sleep patterns, exercise, and feel good about the strength she shows working with goal-directed behaviors to move forward in a more positive direction of improved overall physical and emotional health and wellbeing.  Self-rating scales:(to be updated next appt) 1-10 depression scale-6 1-10 anxiety scale-7 1-10 self-esteem scale-7 1-10 motivation scale-3 1-10 hopefulness scale-8  Goal review and progress/challenges noted with patient.  Next appointment within 2 weeks.   Mathis Fare, LCSW

## 2023-04-02 ENCOUNTER — Ambulatory Visit: Payer: BC Managed Care – PPO | Admitting: Psychiatry

## 2023-04-11 ENCOUNTER — Other Ambulatory Visit: Payer: Self-pay | Admitting: Family Medicine

## 2023-04-11 DIAGNOSIS — Z1231 Encounter for screening mammogram for malignant neoplasm of breast: Secondary | ICD-10-CM

## 2023-04-19 ENCOUNTER — Ambulatory Visit (INDEPENDENT_AMBULATORY_CARE_PROVIDER_SITE_OTHER): Payer: BC Managed Care – PPO | Admitting: Psychiatry

## 2023-04-19 DIAGNOSIS — F411 Generalized anxiety disorder: Secondary | ICD-10-CM | POA: Diagnosis not present
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
## 2023-04-24 ENCOUNTER — Ambulatory Visit: Payer: BC Managed Care – PPO | Admitting: Psychiatry

## 2023-05-01 ENCOUNTER — Ambulatory Visit (INDEPENDENT_AMBULATORY_CARE_PROVIDER_SITE_OTHER): Payer: BC Managed Care – PPO | Admitting: Psychiatry

## 2023-05-01 DIAGNOSIS — F411 Generalized anxiety disorder: Secondary | ICD-10-CM | POA: Diagnosis not present

## 2023-05-01 NOTE — Progress Notes (Signed)
[facility] Counselor/Therapist Progress Note  Patient ID: Ana Walker, MRN: [id number],    Date: 05/01/2023  Time Spent: 53 minutes   Treatment Type: Individual Therapy  Reported Symptoms: anxiety, depression, "and my time management is really bad"  Mental Status Exam:  Appearance:   Casual     Behavior:  Appropriate, Sharing, and Motivated  Motor:  Normal  Speech/Language:   Clear and Coherent  Affect:  Depressed and anxiety  Mood:  anxious and depressed  Thought process:  goal directed  Thought content:    WNL  Sensory/Perceptual disturbances:    WNL  Orientation:  oriented to person, place, time/date, situation, day of week, month of year, year, and stated date of Aug.   Attention:  Good  Concentration:  Good  Memory:  WNL  Fund of knowledge:   Good  Insight:    Good and Fair  Judgment:   Good and Fair  Impulse Control:  Good and Fair   Risk Assessment: Danger to Self:  No Self-injurious Behavior: No Danger to Others: No Duty to Warn:no Physical Aggression / Violence:No  Access to Firearms a concern: No  Gang Involvement:No   Subjective:   Patient in for session today and continued her work on her anxiety, depression, improving her insight, overthinking, stress management, focusing more on healthier diet and exercise, and establishing healthier boundaries particularly in family relationships.  Is very clear that she needs to have healthier boundaries within the family due to so many "negative dynamics".  Her faith remains very important to her and clearly is an important anchor in her life. Discussed this in more detail as she feels her faith is important in her healing. Taking a more active role in her health and needing to lose weight as she has begun walking, and trying to eat healthier. Focused more today on her anxiety about being overweight and her concerns, fears, hopes and trying to believe in herself. Continues increase in water intake. Continued work  on some of the family dynamics that she considers "toxic", and how she is already working on developing healthier boundaries, however encountering challenges. Self-forgiveness is a work in progress.  Interventions: Cognitive Behavioral Therapy and Ego-Supportive  Long term goal: Reduce overall level, frequency, and intensity of the anxiety so that daily functioning is not impaired. Short term goal: Increase understanding of beliefs and messages that produce worry and anxiety. Strategies: Identify, challenge, and replace anxious/fearful self talk with positive, realistic, and empowering self talk.  Diagnosis:   ICD-10-CM   1. Generalized anxiety disorder  F41.1      Plan: Patient in today participating well in session as she worked more on her personal stressors, family stressors, and work situations that are difficult for her.  Is realizing more how some of her old habits are not working well for her but they are also hard to change.  Is committed to making healthier changes and reports several ways in which she has already started this.  Patient is making progress and needs to continue working with her goal-directed behaviors in order to move in a forward direction. Encouraged patient in practicing more positive and self affirming behaviors including: Use of deep breathing exercises to help with her anxiety, healthier eating choices, meditation, getting outside daily and walking, use of music and prayers as she feels this is helpful to her emotionally, "pause and reset" technique, reading inspirational books, taking charge and making healthier decisions including increased water intake for hydration, healthy  sleep patterns, exercise, limiting her caffeine intake, and feeling good about the strength she shows working with goal-directed behaviors to move in a more positive direction of improved overall physical and emotional health and outlook.  Goal review and progress/challenges noted with  patient.  Next appt. within 2-3 weeks.   Mathis Fare, LCSW

## 2023-05-18 ENCOUNTER — Ambulatory Visit: Payer: BC Managed Care – PPO | Admitting: Psychiatry

## 2023-05-22 ENCOUNTER — Ambulatory Visit: Payer: Medicaid Other

## 2023-06-04 ENCOUNTER — Ambulatory Visit (INDEPENDENT_AMBULATORY_CARE_PROVIDER_SITE_OTHER): Payer: BC Managed Care – PPO | Admitting: Psychiatry

## 2023-06-04 DIAGNOSIS — F411 Generalized anxiety disorder: Secondary | ICD-10-CM

## 2023-06-04 NOTE — Progress Notes (Signed)
[facility] Counselor/Therapist Progress Note  Patient ID: Ana Walker, MRN: [id number],    Date: 06/04/2023  Time Spent:  55 minutes  Treatment Type: Individual Therapy  Reported Symptoms: anxiety, depression improved some   Mental Status Exam:  Appearance:   Casual     Behavior:  Appropriate, Sharing, and Motivated  Motor:  Normal  Speech/Language:   Clear and Coherent  Affect:  anxious  Mood:  anxious and depression (improving)  Thought process:  goal directed  Thought content:    Rumination  Sensory/Perceptual disturbances:    WNL  Orientation:  oriented to person, place, time/date, situation, day of week, month of year, year, and stated date of Sept. 9, 2024  Attention:  Good  Concentration:  Good  Memory:  WNL  Fund of knowledge:   Good  Insight:    Good and Fair  Judgment:   Good and Fair  Impulse Control:  Good and Fair   Risk Assessment: Danger to Self:  No Self-injurious Behavior: No Danger to Others: No Duty to Warn:no Physical Aggression / Violence:No  Access to Firearms a concern: No  Gang Involvement:No   Subjective:   Patient in today as she continues her work on symptoms of anxiety, improving insight, overthinking, trying to improve her insight, and some depression. Today needing to focus more on her overthinking, spirituality, and life choices. Feels her sense of spirituality influences her daily life choices and adds that her choices in life lately have not been good. Gave specific examples of these in session today. (Not all details included in this note due to patient privacy needs.) Realizing some relationships are not healthy and needing to set appropriate boundaries, "even within my church/spiritual friends." Shares she saw brother recently for first time since last Christmas, processing now she acted like she didn't see him she was concerned how he might react due to some of their history, and not wanting to be exposed to some of the things  he is involved in and not really trusting him or the situation. Discussed goals more and wants to be more committed especially health related: Drinking at least 3 (8 ounce cups per day), walking at least 3 days (the 3 days she is working) for at least 20 minutes each of those days, and paying closer attention to healthier meal choices, limit time on facebook  to 30 minutes per day, and continue to set healthier boundaries at work including sometimes saying "no". Recognizes the need to be more self-forgiving and believe more in herself. Discussed her need to be more active in her own health needs and develop more belief in herself and healthy changes she wants to make.   Interventions: Cognitive Behavioral Therapy and Solution-Oriented/Positive Psychology  Long term goal: Reduce overall level, frequency, and intensity of the anxiety so that daily functioning is not impaired. Short term goal: Increase understanding of beliefs and messages that produce worry and anxiety. Strategies: Identify, challenge, and replace anxious/fearful self talk with positive, realistic, and empowering self talk.  Diagnosis:   ICD-10-CM   1. Generalized anxiety disorder  F41.1      Plan:   Patient in session today participating well and showing good motivation with some encouragement from therapist but also patient pushing herself a little more to really follow through more on her goals, making them more achievable for now which she feels will help her follow through to be better.  Patient is making progress on her goals and needs to  continue working with goal-directed behaviors to move forward in a healthier and more forward direction. Reminded and encouraged patient in her practice of positive and self affirming behaviors as noted in sessions including: Deep breathing exercises to help with her anxiety, healthier eating choices, meditation, getting outside daily and walking, use of music and prayers as she feels this is  very helpful to her emotionally and spiritually, "pause and reset" technique, reading inspirational books, taking charge and making healthier decisions including increased water intake for hydration, healthy sleep patterns, exercise, limiting her caffeine intake, and feeling good about the strength she shows working with goal-directed behaviors to move in a positive direction of improved overall physical and emotional health impacting her overall wellbeing.  Goal review and progress/challenges noted with patient.  Next appointment within 2 to 3 weeks.   Mathis Fare, LCSW

## 2023-06-05 ENCOUNTER — Ambulatory Visit
Admission: RE | Admit: 2023-06-05 | Discharge: 2023-06-05 | Disposition: A | Discharge status: Home / Self Care | Payer: BC Managed Care – PPO | Source: Ambulatory Visit | Attending: Family Medicine | Admitting: Family Medicine

## 2023-06-05 DIAGNOSIS — Z1231 Encounter for screening mammogram for malignant neoplasm of breast: Secondary | ICD-10-CM

## 2023-06-19 ENCOUNTER — Ambulatory Visit: Payer: BC Managed Care – PPO | Admitting: Psychiatry

## 2023-07-03 ENCOUNTER — Ambulatory Visit: Payer: BC Managed Care – PPO | Admitting: Psychiatry

## 2023-07-03 DIAGNOSIS — F411 Generalized anxiety disorder: Secondary | ICD-10-CM | POA: Diagnosis not present

## 2023-07-03 NOTE — Progress Notes (Signed)
[facility] Counselor/Therapist Progress Note  Patient ID: Ana Walker, MRN: [id number],    Date: 07/03/2023  Time Spent:  53 minutes  Treatment Type: Individual Therapy  Reported Symptoms:   Anxiety, depression, overthinking, trying to improve her insight.   Mental Status Exam:  Appearance:   Casual     Behavior:  Appropriate, Sharing, and Motivated  Motor:  Normal  Speech/Language:   Clear and Coherent  Affect:  Depressed and anxious  Mood:  anxious and depressed  Thought process:  normal  Thought content:    WNL  Sensory/Perceptual disturbances:    WNL  Orientation:  oriented to person, place, time/date, situation, day of week, month of year, year, and stated date of Oct. 8, 2024  Attention:  Good  Concentration:  Good  Memory:  WNL  Fund of knowledge:   Good  Insight:    Good  Judgment:   Good  Impulse Control:  Good and Fair   Risk Assessment: Danger to Self:  No Self-injurious Behavior: No Danger to Others: No Duty to Warn:no Physical Aggression / Violence:No  Access to Firearms a concern: No  Gang Involvement:No   Subjective:   Patient in session today focusing on her anxiety, some depression, overthinking, and wanting to improve her insight. Is taking Ozempic again to help with weight loss and struggling to make better choices. Had been on it and stopped but Dr has started her back on it. States "Hospice has given my mom 6 months to live." Patient needing share and process her mom's condition in session today. Sad but also some mixed/conflicted feelings which she shared openly today, and tearfully talked through some anticipatory grief but also some realization of a lot of different feelings and processed them more in session today. Reports "less guilt" over some issue we've been working on. Working to decrease her overthinking.Trying to make better choices. Has been working to get a better work schedule and feels this is happening soon. Shared some  boundaries she is trying to use now some with family and church friends. Situation with brother challenging especially with trust issues. (Not all details included in this note due to patient privacy needs.) Trust issues with other also. Continues to try drinking more water for health reasons, walking and trying to make better choices in her eating, having limits for her time on facebook, and practicing saying "No" as she needs to say "no". Working to be more self-accepting and self-forgiving, as she pursues healthy changes for herself.  Interventions: Cognitive Behavioral Therapy and Ego-Supportive  Long term goal: Reduce overall level, frequency, and intensity of the anxiety so that daily functioning is not impaired. Short term goal: Increase understanding of beliefs and messages that produce worry and anxiety. Strategies: Identify, challenge, and replace anxious/fearful self talk with positive, realistic, and empowering self talk.  Diagnosis:   ICD-10-CM   1. Generalized anxiety disorder  F41.1      Plan:  Patient in session today showing good motivation and actively participating as she worked further on her goals regarding her anxiety, excessive worry, improving her self talk, overthinking, working to improve insight, and confronting depression.Has made progress and needs to continue working with goal-directed behaviors to move in a healthier direction. Reminded and encouraged patient in her practice of positive and self affirming behaviors as noted in sessions including: Deep breathing exercises to help with her anxiety, healthier eating choices, meditation, getting outside daily and walking, use of music and prayers as she  feels this is very helpful to her emotionally and spiritually, "pause and reset" technique, reading inspirational books, taking charge and making healthier decisions including increased water intake for hydration, healthy sleep patterns, limiting of her caffeine intake, and  feeling good about the strength she shows working with goal-directed behaviors to move in a positive direction of improved overall physical and emotional health and her outlook into the future.  Goal review and progress/challenges noted with patient.  Next appointment within 2 to 3 weeks.   Mathis Fare, LCSW

## 2023-07-11 ENCOUNTER — Encounter (HOSPITAL_COMMUNITY): Payer: Self-pay | Admitting: *Deleted

## 2023-07-17 ENCOUNTER — Ambulatory Visit: Payer: BC Managed Care – PPO | Admitting: Psychiatry

## 2023-07-17 DIAGNOSIS — F411 Generalized anxiety disorder: Secondary | ICD-10-CM | POA: Diagnosis not present

## 2023-07-17 NOTE — Progress Notes (Signed)
[facility] Counselor/Therapist Progress Note  Patient ID: Ana Walker, MRN: [id number],    Date: 07/17/2023  Time Spent: 50 minutes   Treatment Type: Individual Therapy  Reported Symptoms: anxiety, depression, overthinking and insight improving   Mental Status Exam:  Appearance:   Casual     Behavior:  Appropriate, Sharing, and Motivated  Motor:  Normal  Speech/Language:   Clear and Coherent  Affect:  Depressed and stressed  Mood:  anxious and depressed  Thought process:  goal directed  Thought content:    WNL  Sensory/Perceptual disturbances:    WNL  Orientation:  oriented to person, place, time/date, situation, day of week, month of year, year, and stated date of Oct. 22, 2024  Attention:  Good  Concentration:  Good  Memory:  WNL  Fund of knowledge:   Good  Insight:    Good and Fair  Judgment:   Good  Impulse Control:  Good and Fair   Risk Assessment: Danger to Self:  No Self-injurious Behavior: No Danger to Others: No Duty to Warn:no Physical Aggression / Violence:No  Access to Firearms a concern: No  Gang Involvement:No   Subjective:  Patient today in for her session and working further on her excessive worrying, depression, overthinking, anxiety, improving her insight, and wanting to feel more positive and hopeful. Supposed to remain on her Ozempic "but did miss it recently but back on track with it." New job schedule is better for her. Hospice still say mom may live around 6 months. Patient dealing  with difficult feelings in relationship with her mom. Overthinking and guilt "but some better". Working on better choices and gave several examples including getting back involved at the gym. Continued working today on specific goal-directed behaviors and checking in and discussing her progress/challenges including: boundary issues with family and some of her friends, her brother and others involving trust issues, needing to drink more water for health reasons  per medical advice given to her, making better eating choices walking more often, limit time on faceboook, be more self-accepting, be more self-forgiving, and practice saying "no" when she needs to say "no".   Interventions: Cognitive Behavioral Therapy and Ego-Supportive  Long term goal: Reduce overall level, frequency, and intensity of the anxiety so that daily functioning is not impaired. Short term goal: Increase understanding of beliefs and messages that produce worry and anxiety. Strategies: Identify, challenge, and replace anxious/fearful self talk with positive, realistic, and empowering self talk.   Diagnosis:   ICD-10-CM   1. Generalized anxiety disorder  F41.1      Plan: Patient in for her session today actively participating and showing good motivation, working more on her goals related to excessive worry, anxiety, overthinking, confronting depression, working to improve insight, and improving her self talk to be more positive. Is starting a weekly Bible study tonight that she feels will help support her efforts in therapy.  She has made some noticeable progress and needs to continue working with her goal-directed behaviors in sessions and outside of sessions to move forward in a healthier and more positive direction. Reminded and encouraged patient in practicing more positive and self affirming behaviors as noted in sessions including: Deep breathing exercises to help with her anxiety, healthier eating choices, meditation, getting outside daily and walking, use of music and prayers as she feels this is very helpful to her emotionally and spiritually, "pause and reset" technique, reading inspirational books, taking charge and making healthier decisions including increased water intake  for hydration, healthy sleep patterns, limit of her caffeine intake, and feeling good about the strength she shows when she works with goal-directed behaviors to move in a positive direction of improved  physical and emotional health and her overall wellbeing.  Goal review and progress/challenges noted with patient.  Next appointment within 2 to 3 weeks.   Mathis Fare, LCSW

## 2023-07-31 ENCOUNTER — Ambulatory Visit: Payer: BC Managed Care – PPO | Admitting: Psychiatry

## 2023-07-31 DIAGNOSIS — F411 Generalized anxiety disorder: Secondary | ICD-10-CM | POA: Diagnosis not present
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
## 2023-08-14 ENCOUNTER — Ambulatory Visit: Payer: BC Managed Care – PPO | Admitting: Psychiatry

## 2023-08-28 ENCOUNTER — Ambulatory Visit: Payer: BC Managed Care – PPO | Admitting: Psychiatry

## 2023-09-13 ENCOUNTER — Encounter (INDEPENDENT_AMBULATORY_CARE_PROVIDER_SITE_OTHER): Payer: Self-pay | Admitting: *Deleted

## 2023-09-20 ENCOUNTER — Encounter (INDEPENDENT_AMBULATORY_CARE_PROVIDER_SITE_OTHER): Payer: Self-pay | Admitting: Gastroenterology

## 2023-09-20 ENCOUNTER — Encounter (INDEPENDENT_AMBULATORY_CARE_PROVIDER_SITE_OTHER): Payer: Self-pay

## 2023-09-20 ENCOUNTER — Ambulatory Visit (INDEPENDENT_AMBULATORY_CARE_PROVIDER_SITE_OTHER): Payer: BC Managed Care – PPO | Admitting: Gastroenterology

## 2023-09-20 ENCOUNTER — Telehealth (INDEPENDENT_AMBULATORY_CARE_PROVIDER_SITE_OTHER): Payer: Self-pay | Admitting: Gastroenterology

## 2023-09-20 VITALS — BP 138/76 | HR 97 | Temp 97.3°F | Ht 63.5 in | Wt 305.3 lb

## 2023-09-20 DIAGNOSIS — K5904 Chronic idiopathic constipation: Secondary | ICD-10-CM | POA: Insufficient documentation

## 2023-09-20 DIAGNOSIS — N83209 Unspecified ovarian cyst, unspecified side: Secondary | ICD-10-CM | POA: Diagnosis not present

## 2023-09-20 DIAGNOSIS — K5900 Constipation, unspecified: Secondary | ICD-10-CM

## 2023-09-20 DIAGNOSIS — Z800 Family history of malignant neoplasm of digestive organs: Secondary | ICD-10-CM

## 2023-09-20 DIAGNOSIS — Z6841 Body Mass Index (BMI) 40.0 and over, adult: Secondary | ICD-10-CM

## 2023-09-20 DIAGNOSIS — K921 Melena: Secondary | ICD-10-CM

## 2023-09-20 DIAGNOSIS — K760 Fatty (change of) liver, not elsewhere classified: Secondary | ICD-10-CM | POA: Diagnosis not present

## 2023-09-20 MED ORDER — PSYLLIUM 58.6 % PO PACK: 1.0000 | PACK | Freq: Two times a day (BID) | ORAL | 2 refills | Status: AC

## 2023-09-20 MED ORDER — POLYETHYLENE GLYCOL 3350 17 G PO PACK: 17.0000 g | PACK | Freq: Two times a day (BID) | ORAL | 0 refills | Status: AC

## 2023-09-20 MED ORDER — PEG 3350-KCL-NA BICARB-NACL 420 G PO SOLR: 4000.0000 mL | Freq: Once | ORAL | 0 refills | Status: AC

## 2023-09-20 NOTE — Telephone Encounter (Signed)
Spoke with [employer] and PA is not required for colonoscopy. Reference number [id number]

## 2023-09-20 NOTE — Patient Instructions (Signed)
It was very nice to meet you today, as dicussed with will plan for the following : 1) Colonoscopy  2)Ensure adequate fluid intake: Aim for 8 glasses of water daily. Follow a high fiber diet: Include foods such as dates, prunes, pears, and kiwi. Take Miralax twice a day for the first week, then reduce to once daily thereafter. Use Metamucil twice a day.
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
## 2023-09-25 ENCOUNTER — Ambulatory Visit: Payer: BC Managed Care – PPO | Admitting: Psychiatry

## 2023-09-27 ENCOUNTER — Encounter (INDEPENDENT_AMBULATORY_CARE_PROVIDER_SITE_OTHER): Payer: Self-pay

## 2023-09-27 ENCOUNTER — Telehealth (INDEPENDENT_AMBULATORY_CARE_PROVIDER_SITE_OTHER): Payer: Self-pay | Admitting: Gastroenterology

## 2023-09-27 ENCOUNTER — Encounter (HOSPITAL_COMMUNITY): Payer: Self-pay | Admitting: Certified Registered Nurse Anesthetist

## 2023-09-27 NOTE — Telephone Encounter (Signed)
 Yes thank you , agree with rescheduling

## 2023-09-27 NOTE — Telephone Encounter (Signed)
 Melanie from Endo called and states that anesthesia contacted her and this pt will need to be asa 3 due to BMI over 50. Left message to return call. Will also send my chart message to pt.

## 2023-09-28 NOTE — Telephone Encounter (Signed)
 Pt contacted. Pt rescheduled to 10/25/23 at 7:30am. New instructions will be mailed once pre op has been received.

## 2023-10-02 ENCOUNTER — Encounter: Payer: BC Managed Care – PPO | Admitting: Nurse Practitioner

## 2023-10-08 ENCOUNTER — Telehealth (INDEPENDENT_AMBULATORY_CARE_PROVIDER_SITE_OTHER): Payer: Self-pay | Admitting: Gastroenterology

## 2023-10-08 NOTE — Telephone Encounter (Signed)
 Pt left voicemail in regards to rescheduling TCS. Originally scheduled for 10/25/23. Pt contacted and rescheduled to 11/15/23. Pt states that day may need to change depending on if the surgeon for her ovary can get her in sooner. Message sent to endo and new instructions printed.

## 2023-10-18 ENCOUNTER — Other Ambulatory Visit (HOSPITAL_COMMUNITY): Payer: BC Managed Care – PPO

## 2023-10-25 ENCOUNTER — Ambulatory Visit: Payer: BC Managed Care – PPO | Admitting: Dietician

## 2023-10-25 ENCOUNTER — Ambulatory Visit (HOSPITAL_COMMUNITY)
Admission: RE | Admit: 2023-10-25 | Payer: BC Managed Care – PPO | Source: Home / Self Care | Admitting: Gastroenterology

## 2023-10-25 ENCOUNTER — Encounter (HOSPITAL_COMMUNITY): Admission: RE | Payer: Self-pay | Source: Home / Self Care

## 2023-10-25 ENCOUNTER — Encounter (HOSPITAL_COMMUNITY): Payer: Self-pay

## 2023-10-25 ENCOUNTER — Ambulatory Visit (HOSPITAL_COMMUNITY): Admit: 2023-10-25 | Payer: BC Managed Care – PPO | Admitting: Gastroenterology

## 2023-10-25 SURGERY — COLONOSCOPY WITH PROPOFOL
Anesthesia: Monitor Anesthesia Care

## 2023-11-01 ENCOUNTER — Ambulatory Visit: Payer: BC Managed Care – PPO | Admitting: Nurse Practitioner

## 2023-11-01 ENCOUNTER — Encounter: Payer: Self-pay | Admitting: Nurse Practitioner

## 2023-11-01 VITALS — BP 136/87 | HR 88 | Temp 98.0°F | Ht 63.0 in | Wt 302.0 lb

## 2023-11-01 DIAGNOSIS — E119 Type 2 diabetes mellitus without complications: Secondary | ICD-10-CM

## 2023-11-01 DIAGNOSIS — Z7985 Long-term (current) use of injectable non-insulin antidiabetic drugs: Secondary | ICD-10-CM

## 2023-11-01 DIAGNOSIS — E66813 Obesity, class 3: Secondary | ICD-10-CM

## 2023-11-01 DIAGNOSIS — Z7984 Long term (current) use of oral hypoglycemic drugs: Secondary | ICD-10-CM

## 2023-11-01 DIAGNOSIS — Z9884 Bariatric surgery status: Secondary | ICD-10-CM | POA: Diagnosis not present

## 2023-11-01 DIAGNOSIS — G4733 Obstructive sleep apnea (adult) (pediatric): Secondary | ICD-10-CM | POA: Diagnosis not present

## 2023-11-01 DIAGNOSIS — Z6841 Body Mass Index (BMI) 40.0 and over, adult: Secondary | ICD-10-CM
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
## 2023-11-01 NOTE — Progress Notes (Signed)
 Ana Walker

## 2023-11-02 ENCOUNTER — Encounter (INDEPENDENT_AMBULATORY_CARE_PROVIDER_SITE_OTHER): Payer: Self-pay | Admitting: Gastroenterology

## 2023-11-02 ENCOUNTER — Encounter: Payer: Self-pay | Admitting: Nurse Practitioner

## 2023-11-05 ENCOUNTER — Telehealth (INDEPENDENT_AMBULATORY_CARE_PROVIDER_SITE_OTHER): Payer: Self-pay | Admitting: Gastroenterology

## 2023-11-05 NOTE — Telephone Encounter (Signed)
 Pt sent my chart message needing to cancel TCS on 11/15/23 due to cost. Please see my chart message from 11/02/23

## 2023-11-13 ENCOUNTER — Encounter (HOSPITAL_COMMUNITY): Payer: BC Managed Care – PPO

## 2023-11-13 ENCOUNTER — Other Ambulatory Visit (HOSPITAL_COMMUNITY): Payer: BC Managed Care – PPO

## 2023-11-15 ENCOUNTER — Ambulatory Visit (HOSPITAL_COMMUNITY)
Admission: RE | Admit: 2023-11-15 | Payer: BC Managed Care – PPO | Source: Home / Self Care | Admitting: Gastroenterology

## 2023-11-15 ENCOUNTER — Encounter (HOSPITAL_COMMUNITY): Admission: RE | Payer: Self-pay | Source: Home / Self Care

## 2023-11-15 SURGERY — COLONOSCOPY WITH PROPOFOL
Anesthesia: Choice

## 2023-11-24 HISTORY — PX: PARTIAL HYSTERECTOMY: SHX80

## 2023-12-04 ENCOUNTER — Encounter: Payer: BC Managed Care – PPO | Attending: Student | Admitting: Skilled Nursing Facility1

## 2023-12-04 ENCOUNTER — Encounter: Payer: Self-pay | Admitting: Skilled Nursing Facility1

## 2023-12-04 DIAGNOSIS — E119 Type 2 diabetes mellitus without complications: Secondary | ICD-10-CM | POA: Diagnosis present
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
## 2024-01-03 ENCOUNTER — Encounter: Attending: Student | Admitting: Skilled Nursing Facility1

## 2024-01-03 ENCOUNTER — Encounter: Payer: Self-pay | Admitting: Skilled Nursing Facility1

## 2024-01-03 VITALS — Ht 63.0 in | Wt 307.8 lb

## 2024-01-03 DIAGNOSIS — E119 Type 2 diabetes mellitus without complications: Secondary | ICD-10-CM | POA: Insufficient documentation
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
## 2024-04-08 LAB — BASIC METABOLIC PANEL WITH GFR
BUN: 15 (ref 4–21)
Chloride: 103 (ref 99–108)
Potassium: 4.4 meq/L (ref 3.5–5.1)
Sodium: 138 (ref 137–147)

## 2024-04-08 LAB — CBC AND DIFFERENTIAL
HCT: 41 (ref 36–46)
Hemoglobin: 13.5 (ref 12.0–16.0)
Platelets: 311 K/uL (ref 150–400)
WBC: 7.6

## 2024-04-08 LAB — HEPATIC FUNCTION PANEL
AST: 21 (ref 13–35)
Alkaline Phosphatase: 60 (ref 25–125)

## 2024-04-08 LAB — CBC
EGFR: 86
RBC: 4.59 (ref 3.87–5.11)
TSH: 0.915

## 2024-04-08 LAB — COMPREHENSIVE METABOLIC PANEL WITH GFR
Albumin: 3.8 (ref 3.5–5.0)
Calcium: 9.2 (ref 8.7–10.7)
Globulin: 2.8

## 2024-04-08 LAB — TSH: TSH: 0.92 (ref 0.41–5.90)

## 2024-04-08 LAB — LIPID PANEL: Triglycerides: 148 (ref 40–160)

## 2024-05-05 DIAGNOSIS — Z0289 Encounter for other administrative examinations: Secondary | ICD-10-CM

## 2024-05-21 ENCOUNTER — Ambulatory Visit: Payer: PRIVATE HEALTH INSURANCE | Admitting: Bariatrics

## 2024-05-21 ENCOUNTER — Encounter: Payer: Self-pay | Admitting: Bariatrics

## 2024-05-21 VITALS — BP 128/86 | HR 68 | Temp 97.7°F | Ht 63.0 in | Wt 316.0 lb

## 2024-05-21 DIAGNOSIS — R0602 Shortness of breath: Secondary | ICD-10-CM

## 2024-05-21 DIAGNOSIS — Z7984 Long term (current) use of oral hypoglycemic drugs: Secondary | ICD-10-CM

## 2024-05-21 DIAGNOSIS — G4733 Obstructive sleep apnea (adult) (pediatric): Secondary | ICD-10-CM

## 2024-05-21 DIAGNOSIS — E559 Vitamin D deficiency, unspecified: Secondary | ICD-10-CM | POA: Diagnosis not present

## 2024-05-21 DIAGNOSIS — Z6841 Body Mass Index (BMI) 40.0 and over, adult: Secondary | ICD-10-CM

## 2024-05-21 DIAGNOSIS — Z9884 Bariatric surgery status: Secondary | ICD-10-CM

## 2024-05-21 DIAGNOSIS — Z1331 Encounter for screening for depression: Secondary | ICD-10-CM

## 2024-05-21 DIAGNOSIS — R5383 Other fatigue: Secondary | ICD-10-CM

## 2024-05-21 DIAGNOSIS — E119 Type 2 diabetes mellitus without complications: Secondary | ICD-10-CM

## 2024-05-21 NOTE — Progress Notes (Signed)
 At a Glance:  Vitals Temp: 97.7 F (36.5 C) BP: 128/86 Pulse Rate: 68 SpO2: 98 %   Anthropometric Measurements Height: 5' 3 (1.6 m) Weight: (!) 316 lb (143.3 kg) BMI (Calculated): 55.99 Starting Weight: 316lb Waist Measurement : 64 inches   Body Composition  Body Fat %: 55 % Fat Mass (lbs): 173.8 lbs Muscle Mass (lbs): 135 lbs Total Body Water (lbs): 102.6 lbs Visceral Fat Rating : 22   Other Clinical Data RMR: 2261 Fasting: yes Labs: yes Today's Visit #: 1 Starting Date: 05/21/24    EKG: EKG done on 05/21d/2025.    Indirect Calorimeter:   Resting Metabolic Rate ( RMR):  RMR (actual): 2261 kcal RMR (calculated): 2096 kcal  The calculated basal metabolic rate is 7903 kcal  thus her basal metabolic rate is better than expected.  Plan:   Indirect calorimeter completed, interpreted and reviewed with patient today and allowed to ask questions.  Discussed the implications for the chosen plan and exercise based on the RMR reading.  Will consider repeating the RMR in the future based on weight loss.    Chief Complaint:  Obesity   Subjective:  Ana Walker (MR# [id number]) is a 48 y.o. female who presents for evaluation and treatment of obesity and related comorbidities.   Ana Walker is currently in the action stage of change and ready to dedicate time achieving and maintaining a healthier weight. Ana Walker is interested in becoming our patient and working on intensive lifestyle modifications including (but not limited to) diet and exercise for weight loss.  Ana Walker has been struggling with her weight. She has been unsuccessful in either losing weight, maintaining weight loss, or reaching her healthy weight goal.  Ana Walker's habits were reviewed today and are as follows: Her family eats meals together, she thinks her family will eat healthier with her, she has been heavy most of her life, she has significant food cravings issues, she wakes up frequently in  the middle of the night to eat, she skips meals frequently, she has problems with excessive hunger, and she struggles with emotional eating.  Current or previous pharmacotherapy: GLP-1- was taking Ozempic 0.5mg  and Metformin 500mg  BID.  Started taking May 2024   Response to medication: Lost weight initially but was unable to sustain weight loss  Other Fatigue Ana Walker denies daytime somnolence and admits to waking up still tired at times.  Ana Walker generally gets 7 or 8 hours of sleep per night, and states that she has generally restful sleep. Snoring is present. Apneic episodes are present occasionally. Epworth Sleepiness Score is 4.   Shortness of Breath Ana Walker notes increasing shortness of breath with exercising and seems to be worsening over time with weight gain. She notes getting out of breath sooner with activity than she used to. This has gotten worse recently. Ana Walker denies shortness of breath at rest or orthopnea.  Depression Screen Ana Walker's Food and Mood (modified PHQ-9) score was 17. 15-19 moderate severe depression     10/31/2019   10:50 AM  Depression screen PHQ 2/9  Decreased Interest 0  Down, Depressed, Hopeless 0  PHQ - 2 Score 0     Assessment and Plan:   Other Fatigue Ana Walker does feel that her weight is causing her energy to be lower than it should be. Fatigue may be related to obesity, depression or many other causes. Labs will be ordered, and in the meanwhile, Ana Walker will focus on self care including making healthy food choices, increasing physical activity and focusing on  stress reduction.  Shortness of Breath Ana Walker does feel that she gets out of breath more easily that she used to when she exercises. Ana Walker's shortness of breath appears to be obesity related and exercise induced. She has agreed to work on weight loss and gradually increase exercise to treat her exercise induced shortness of breath. Will continue to monitor closely.  Health  Maintenance:   Obesity   Plan: Will do an indirect calorimetry, and labs.     Type II Diabetes HgbA1c is not at goal. Last A1c was 6.4 CBGs: Not checking      Episodes of hypoglycemia: no Medication(s): Metformin-XR 500 mg daily.   No results found for: HGBA1C Lab Results  Component Value Date   CREATININE 0.84 11/17/2021   No results found for: GFR  Plan: Continue metformin XR 500 mg, total of 1500 mg daily.  Continue all other medications.  Will keep all carbohydrates low both sweets and starches.  Will continue exercise regimen to 30 to 60 minutes on most days of the week.  Aim for 7 to 9 hours of sleep nightly.  Eat more low glycemic index foods.   Vitamin D  Deficiency Vitamin D  is at goal of 50.  Most recent vitamin D  level was 51.6. She is at risk for vitamin D  deficiency due to obesity.  She is on  a bariatric vitamin  and a calcium chew. .   Plan: Will check for vitamin D  deficiency.   Ana Walker had a positive depression screening. Depression is commonly associated with obesity and often results in emotional eating behaviors. We will monitor this closely and work on CBT to help improve the non-hunger eating patterns. Referral to Psychology may be required if no improvement is seen as she continues in our clinic.   Obstructive Sleep Apnea Ana Walker has a diagnosis of sleep apnea. She reports that she is using a CPAP regularly. Reports restful sleep most of the time but not always.  She states that her last sleep study was years ago and that she think she needs an update.   Plan: Continue CPAP therapy. Continue to practice good sleep hygiene.  Will add in elements of an antiinflammatory diet and  add in elements of a Mediterranean diet.  Reduce intake of carbohydrates for weight loss.  Referral for a sleep study ( has been a long time ago).   History of Bariatric Surgery (2019):     Dr/Facility/State: Ana Walker, Ana Walker surgery. Year:  2019 Complications: Denies Highest Weight: 347 pounds Lowest Weight: 267 pounds Taking vitamins: Yes Hx of deficiencies: none  Hx of iron infusions: none  Plan Counseling You may need to eat 3 meals and 2 snacks, or 5 small meals each day in order to reach your protein and calorie goals.  Allow at least 15 minutes for each meal so that you can eat mindfully. Listen to your body so that you do not overeat. For most people, your sleeve or pouch will comfortably hold 4-6 ounces. Eat foods from all food groups. This includes fruits and vegetables, grains, dairy, and meat and other proteins. Include a protein-rich food at every meal and snack, and eat the protein food first.  She will continue  taking a Bariatric Multivitamin as well as calcium.      Previous labs reviewed today. Date: 11/17/23, 11/08/23, 10/09/23 CMP, HgbA1c, Vit D, and Vit B12, CBC, and anemia panel.   Labs done today CMP, Lipids, Insulin , Vit D, Thyroid  Panel, and Microalbumin   Morbid  Obesity: BMI (Calculated): 55.99   Ana Walker is currently in the action stage of change and her goal is to begin weight loss efforts. I recommend Ana Walker begin the structured treatment plan as follows:  She has agreed to Category 3 Plan  Exercise goals: All adults should avoid inactivity. Some activity is better than none, and adults who participate in any amount of physical activity, gain some health benefits.  Behavioral modification strategies:increasing lean protein intake, decreasing simple carbohydrates, increasing vegetables, increase H2O intake, increase high fiber foods, no skipping meals, keeping healthy foods in the home, better snacking choices, avoiding temptations, and planning for success  She was informed of the importance of frequent follow-up visits to maximize her success with intensive lifestyle modifications for her multiple health conditions. She was informed we would discuss her lab results at her next visit  unless there is a critical issue that needs to be addressed sooner. Ana Walker agreed to keep her next visit at the agreed upon time to discuss these results.  Objective:  General: Cooperative, alert, well developed, in no acute distress. HEENT: Conjunctivae and lids unremarkable. Cardiovascular: Regular rhythm.  Lungs: Normal work of breathing. Neurologic: No focal deficits.   Lab Results  Component Value Date   CREATININE 0.84 11/17/2021   BUN 14 11/17/2021   NA 138 11/17/2021   K 4.3 11/17/2021   CL 102 11/17/2021   CO2 26 11/17/2021   Lab Results  Component Value Date   ALT 32 11/17/2021   AST 34 11/17/2021   ALKPHOS 82 11/17/2021   BILITOT 0.8 11/17/2021   No results found for: HGBA1C No results found for: INSULIN  No results found for: TSH No results found for: CHOL, HDL, LDLCALC, LDLDIRECT, TRIG, CHOLHDL Lab Results  Component Value Date   WBC 6.9 11/17/2021   HGB 12.4 11/17/2021   HCT 37.6 11/17/2021   MCV 93.3 11/17/2021   PLT 232 11/17/2021   No results found for: IRON, TIBC, FERRITIN  Attestation Statements:  Applicable history such as the following:  allergies, medications, problem list, medical history, surgical history, family history, social history, and previous encounter notes reviewed by clinician on day of visit:  Time spent on visit in care of the patient today including the items listed below was 50 minutes.   20 minutes were spent talking about the history, 25 minutes for face to face counseling implementing the plan, discussing the specifics of how to arrange meals, meal planning, water intake.   I spent face to face time discussing his/her plan, including breakfast, additional breakfast options, lunch, and dinner options, grocery list, and snacks.  I reviewed her indirect calorimetry. I discussed the implications for the diet plan.    Discussed the bio-impedence test (fat %, muscle mass, and water weight) and allowed the  patient to ask questions.   Discussed the following information sheets: Category 3, snack sheets both 102 100-calorie, routine shake sheet, microwave meals.    I reviewed the labs which were ordered from her visits on 11/17/2023,/11/08/2023,/10/09/2023.  I additionally spent time documenting, reviewing, and checking the codes before submitting.   This may have been prepared with the assistance of Engineer, civil (consulting).  Occasional wrong-word or sound-a-like substitutions may have occurred due to the inherent limitations of voice recognition software.    Cora ONEIDA Hint, CMA

## 2024-05-22 LAB — COMPREHENSIVE METABOLIC PANEL WITH GFR
ALT: 33 IU/L — ABNORMAL HIGH (ref 0–32)
AST: 22 IU/L (ref 0–40)
Albumin: 4.3 g/dL (ref 3.9–4.9)
Alkaline Phosphatase: 75 IU/L (ref 44–121)
BUN/Creatinine Ratio: 15 (ref 9–23)
BUN: 10 mg/dL (ref 6–24)
Bilirubin Total: 0.5 mg/dL (ref 0.0–1.2)
CO2: 21 mmol/L (ref 20–29)
Calcium: 9.5 mg/dL (ref 8.7–10.2)
Chloride: 101 mmol/L (ref 96–106)
Creatinine, Ser: 0.68 mg/dL (ref 0.57–1.00)
Globulin, Total: 2.8 g/dL (ref 1.5–4.5)
Glucose: 117 mg/dL — ABNORMAL HIGH (ref 70–99)
Potassium: 4.6 mmol/L (ref 3.5–5.2)
Sodium: 140 mmol/L (ref 134–144)
Total Protein: 7.1 g/dL (ref 6.0–8.5)
eGFR: 107 mL/min/1.73 (ref >59)

## 2024-05-22 LAB — TSH+T4F+T3FREE
Free T4: 0.98 ng/dL (ref 0.82–1.77)
T3, Free: 3 pg/mL (ref 2.0–4.4)
TSH: 0.852 u[IU]/mL (ref 0.450–4.500)

## 2024-05-22 LAB — LIPID PANEL WITH LDL/HDL RATIO
Cholesterol, Total: 222 mg/dL — ABNORMAL HIGH (ref 100–199)
HDL: 28 mg/dL — ABNORMAL LOW (ref >39)
LDL Chol Calc (NIH): 180 mg/dL — ABNORMAL HIGH (ref 0–99)
LDL/HDL Ratio: 6.4 ratio — ABNORMAL HIGH (ref 0.0–3.2)
Triglycerides: 79 mg/dL (ref 0–149)
VLDL Cholesterol Cal: 14 mg/dL (ref 5–40)

## 2024-05-22 LAB — MICROALBUMIN / CREATININE URINE RATIO
Creatinine, Urine: 115 mg/dL
Microalb/Creat Ratio: 22 mg/g{creat} (ref 0–29)
Microalbumin, Urine: 25.1 ug/mL

## 2024-05-22 LAB — INSULIN, RANDOM: INSULIN: 20.7 u[IU]/mL (ref 2.6–24.9)

## 2024-05-22 LAB — VITAMIN D 25 HYDROXY (VIT D DEFICIENCY, FRACTURES): Vit D, 25-Hydroxy: 49.3 ng/mL (ref 30.0–100.0)

## 2024-05-27 ENCOUNTER — Other Ambulatory Visit: Payer: Self-pay

## 2024-05-27 ENCOUNTER — Encounter: Payer: Self-pay | Admitting: Bariatrics

## 2024-05-27 DIAGNOSIS — E785 Hyperlipidemia, unspecified: Secondary | ICD-10-CM | POA: Insufficient documentation

## 2024-06-05 ENCOUNTER — Ambulatory Visit: Payer: PRIVATE HEALTH INSURANCE | Admitting: Psychiatry

## 2024-06-05 DIAGNOSIS — F4310 Post-traumatic stress disorder, unspecified: Secondary | ICD-10-CM

## 2024-06-05 NOTE — Progress Notes (Signed)
 [facility] Counselor/Therapist Progress Note  Patient ID: Ana Walker, MRN: [id number],    Date: 06/05/2024  Time Spent:  47 minutes  Treatment Type: Individual Therapy  Reported Symptoms: anxious, often edgy and trying to deal with unresolved issues in past, depression, insight improving, overthinking, anger at brother/family situation    Mental Status Exam:  Appearance:   Casual     Behavior:  Appropriate, Sharing, and Motivated  Motor:  Normal  Speech/Language:   Clear and Coherent  Affect:  Depressed and anxious  Mood:  anxious and depressed  Thought process:  goal directed  Thought content:    Rumination  Sensory/Perceptual disturbances:    WNL  Orientation:  oriented to person, place, time/date, situation, day of week, month of year, year, and stated date of Sept. 11, 2025  Attention:  Fair  Concentration:  Good and Fair  Memory:  WNL  Fund of knowledge:   Good  Insight:    Good and Fair  Judgment:   Good  Impulse Control:  Fair   Risk Assessment: Danger to Self:  No Self-injurious Behavior: No Danger to Others: No Duty to Warn:no Physical Aggression / Violence:No  Access to Firearms a concern: No  Gang Involvement:No   Subjective:   Patient returning for additional treatment today working on her anger, anxiety, avoidance, still stressed and impacted due to prior traumatic stress that she has not yet received help but does seem more open to this today, depression, overthinking (past and present), and some family conflict situations. Wants to feel more positive and hopeful. Denies any SI. Focusing further on issues in relationship with mother and brother. Still struggling a lot with some traumatic situations from past and came in today wanting to share more info, feel heard, and process some of her the trauma in her past that has not improved during the time she has been out of therapy. Listening carefully to patient today and hearing how trauma from the  past is still very much impacting almost every day. Processed this with patient at length and also discussed her willingness to get some trauma-focused therapy which seems to be what she is really needing at this point and she is in agreement. Discussed a local therapy group that specializes in trauma therapy and patient is in agreement in following up with them so as to get the help she is really needing at this point. Gave her information on how to reach that therapy office and she is going to contact them and let me know when that contact has happened and that she has an appt scheduled.   Interventions: Cognitive Behavioral Therapy, Solution-Oriented/Positive Psychology, and Ego-Supportive Long term goal: Reduce overall level, frequency, and intensity of the anxiety so that daily functioning is not impaired. Short term goal: Increase understanding of beliefs and messages that produce worry and anxiety. Strategies: Identify, challenge, and replace anxious/fearful self talk with positive, realistic, and empowering self talk.  Diagnosis:   ICD-10-CM   1. PTSD (post-traumatic stress disorder)  F43.10      Plan:    Patient today talking very openly and it was clear that she is really needing trauma focused therapy which I discussed with her and shared some local options for treatment with her.  She was receptive and took the information I gave her at end of session and is following up with it.  Patient states she will be in contact with the other practice to get scheduled and is going  to call back to our office just to verify when she has been scheduled at another location that specializes in trauma therapy.  Good motivation and participation in session today and does seem open and receptive to working further on her past trauma in order to eventually be able to move forward in a healthier direction.   Barnie Bunde, LCSW

## 2024-06-11 ENCOUNTER — Ambulatory Visit: Admitting: Bariatrics

## 2024-06-24 ENCOUNTER — Ambulatory Visit: Payer: PRIVATE HEALTH INSURANCE | Admitting: Neurology

## 2024-06-24 ENCOUNTER — Encounter: Payer: Self-pay | Admitting: Neurology

## 2024-06-24 ENCOUNTER — Encounter: Payer: Self-pay | Admitting: Bariatrics

## 2024-06-24 VITALS — BP 139/82 | HR 77 | Ht 63.0 in | Wt 322.0 lb

## 2024-06-24 DIAGNOSIS — F4310 Post-traumatic stress disorder, unspecified: Secondary | ICD-10-CM | POA: Diagnosis not present

## 2024-06-24 DIAGNOSIS — Z6841 Body Mass Index (BMI) 40.0 and over, adult: Secondary | ICD-10-CM

## 2024-06-24 DIAGNOSIS — F411 Generalized anxiety disorder: Secondary | ICD-10-CM | POA: Diagnosis not present

## 2024-06-24 DIAGNOSIS — K760 Fatty (change of) liver, not elsewhere classified: Secondary | ICD-10-CM | POA: Diagnosis not present

## 2024-06-24 DIAGNOSIS — G4733 Obstructive sleep apnea (adult) (pediatric): Secondary | ICD-10-CM

## 2024-06-24 MED ORDER — ALPRAZOLAM 0.5 MG PO TABS: 0.5000 mg | ORAL_TABLET | Freq: Every evening | ORAL | 0 refills | Status: DC | PRN
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
## 2024-06-25 ENCOUNTER — Encounter: Payer: Self-pay | Admitting: Bariatrics

## 2024-06-25 ENCOUNTER — Ambulatory Visit: Payer: PRIVATE HEALTH INSURANCE | Admitting: Bariatrics

## 2024-06-25 VITALS — BP 143/82 | HR 71 | Temp 97.9°F | Ht 63.0 in | Wt 314.0 lb

## 2024-06-25 DIAGNOSIS — E119 Type 2 diabetes mellitus without complications: Secondary | ICD-10-CM | POA: Diagnosis not present

## 2024-06-25 DIAGNOSIS — Z7984 Long term (current) use of oral hypoglycemic drugs: Secondary | ICD-10-CM

## 2024-06-25 DIAGNOSIS — E66813 Obesity, class 3: Secondary | ICD-10-CM

## 2024-06-25 DIAGNOSIS — E559 Vitamin D deficiency, unspecified: Secondary | ICD-10-CM | POA: Diagnosis not present

## 2024-06-25 DIAGNOSIS — E785 Hyperlipidemia, unspecified: Secondary | ICD-10-CM

## 2024-06-25 DIAGNOSIS — E1169 Type 2 diabetes mellitus with other specified complication: Secondary | ICD-10-CM

## 2024-06-25 DIAGNOSIS — Z6841 Body Mass Index (BMI) 40.0 and over, adult: Secondary | ICD-10-CM

## 2024-06-25 MED ORDER — VITAMIN D (ERGOCALCIFEROL) 1.25 MG (50000 UNIT) PO CAPS: 50000.0000 [IU] | ORAL_CAPSULE | ORAL | 0 refills | Status: DC

## 2024-06-25 MED ORDER — ROSUVASTATIN CALCIUM 20 MG PO TABS: 20.0000 mg | ORAL_TABLET | Freq: Every day | ORAL | 0 refills | Status: DC

## 2024-06-25 NOTE — Progress Notes (Signed)
 First follow-up after initial visit.        WEIGHT SUMMARY AND BIOMETRICS  Weight Lost Since Last Visit: 0  Weight Gained Since Last Visit: 2lb   Vitals Temp: 97.9 F (36.6 C) BP: (!) 142/80 Pulse Rate: 71 SpO2: 98 %   Anthropometric Measurements Height: 5' 3 (1.6 m) Weight: (!) 314 lb (142.4 kg) BMI (Calculated): 55.64 Weight at Last Visit: 316lb Weight Lost Since Last Visit: 0 Weight Gained Since Last Visit: 2lb Starting Weight: 316lb Total Weight Loss (lbs): 0 lb (0 kg) Waist Measurement : 64 inches   Body Composition  Body Fat %: 52.9 % Fat Mass (lbs): 166.2 lbs Muscle Mass (lbs): 140.4 lbs Total Body Water (lbs): 101.8 lbs Visceral Fat Rating : 21   Other Clinical Data Fasting: no Labs: no Today's Visit #: 2 Starting Date: 05/21/24    OBESITY Ana Walker Walker is here to discuss her progress with her obesity treatment plan along with follow-up of her obesity related diagnoses.    Nutrition Plan: the Category 3 plan - 75% adherence.  Current exercise: Walking  Interim History:  She is up 2 lbs since her first visit.  She states that she had a normal procedure and has a temporary crown and will not have her permanent crown until 07/02/2024 and has not been eating to plan exactly per her inability to chew certain foods she also had an accident and injured her elbow.  She has been able to swim some. Not eating all of the food on the plan., Protein intake is less than prescribed., Is skipping meals, Not journaling consistently., and Water intake is inadequate.  Initial positives regarding the dietary plan: She is trying to increase her protein.  Initial challenges regarding  the dietary plan: She has been unable to eat certain foods secondary to her dental procedure.   Pharmacotherapy: Ana Walker Walker is on metformin XR 500 mg 1500 mg total  daily. Adverse side effects: None Hunger is moderately controlled.  Cravings are moderately controlled.  Assessment/Plan:   Hyperlipidemia LDL is not at goal. Medication(s): none Cardiovascular risk factors: diabetes mellitus, dyslipidemia, obesity (BMI >= 30 kg/m2), and sedentary lifestyle  Lab Results  Component Value Date   CHOL 222 (H) 05/21/2024   HDL 28 (L) 05/21/2024   LDLCALC 180 (H) 05/21/2024   TRIG 79 05/21/2024   Lab Results  Component Value Date   ALT 33 (H) 05/21/2024   AST 22 05/21/2024   ALKPHOS 75 05/21/2024   BILITOT 0.5 05/21/2024   The 10-year ASCVD risk score (Arnett DK, et al., 2019) is: 7.5%   Values used to calculate the score:     Age: 48 years     Clincally relevant sex: Female     Is Non-Hispanic African American: No     Diabetic: Yes     Tobacco smoker: No     Systolic Blood Pressure: 143 mmHg     Is BP treated: No     HDL Cholesterol: 28 mg/dL     Total Cholesterol: 222 mg/dL  Plan:  Will begin Crestor 20 mg 1 daily at night. Will begin CoQ 10 OTC concurrently.  Will avoid all trans fats.  Will read labels Will minimize saturated fats except the following: low fat meats in moderation, diary, and limited dark chocolate.   Vitamin D  Insuffiency:  Vitamin D  is not at goal of 50.  Most recent vitamin D  level was 49.3. She is not on any vitamin D . Lab Results  Component Value Date  VD25OH 49.3 05/21/2024    Plan: Begin prescription vitamin D  50,000 IU weekly.   Type 2 Diabetes:  Last A1c was 6.4 on 05-22-2024.   Medication(s): Metformin 1,500 mg daily.  No results found for: HGBA1C Lab Results  Component Value Date   INSULIN  20.7 05/21/2024    Plan: Will minimize all refined carbohydrates both sweets and starches.  Will work on the plan and exercise.  Consider both aerobic and resistance training.  Will keep protein, water, and fiber intake high.  Increase Polyunsaturated and Monounsaturated fats to increase satiety and  encourage weight loss.  Continue Metformin 1,500 mg per day.    Labs reviewed from her last visit (CMP, Lipids, insulin , vitamin D , glucose, and thyroid  panel).  Also reviewed [facility] [place] [facility] date 05/22/2024 CBC, CMP hemoglobin A1c and cholesterol.   Morbid Obesity: Current BMI BMI (Calculated): 55.64   Pharmacotherapy Plan Continue metformin at current dose and will consider a GLP-1 in the future.   Ana Walker Walker is currently in the action stage of change. As such, her goal is to continue with weight loss efforts.  She has agreed to the Category 3 plan.  Exercise goals: All adults should avoid inactivity. Some physical activity is better than none, and adults who participate in any amount of physical activity gain some health benefits. She will continue to swim several times per week.  Behavioral modification strategies: increasing lean protein intake, decreasing simple carbohydrates , meal planning , increase water intake, better snacking choices, planning for success, increasing vegetables, decrease snacking , avoiding temptations, keep healthy foods in the home, increase frequency of journaling, and work on smaller portions.  Ana Walker Walker has agreed to follow-up with our clinic in 2 weeks.   Labs reviewed today from last visit (CMP, Lipids, HgbA1c, insulin , vitamin D , B 12, and thyroid  panel).   Objective:   VITALS: Per patient if applicable, see vitals. GENERAL: Alert and in no acute distress. CARDIOPULMONARY: No increased WOB. Speaking in clear sentences.  PSYCH: Pleasant and cooperative. Speech normal rate and rhythm. Affect is appropriate. Insight and judgement are appropriate. Attention is focused, linear, and appropriate.  NEURO: Oriented as arrived to appointment on time with no prompting.   Attestation Statements:    This was prepared with the assistance of Engineer, civil (consulting).  Occasional wrong-word or sound-a-like substitutions may have occurred due  to the inherent limitations of voice recognition software.   Clayborne Daring, DO

## 2024-07-07 ENCOUNTER — Encounter: Payer: Self-pay | Admitting: Bariatrics

## 2024-07-07 ENCOUNTER — Ambulatory Visit: Payer: PRIVATE HEALTH INSURANCE | Admitting: Bariatrics

## 2024-07-07 VITALS — BP 140/88 | HR 75 | Temp 97.7°F | Ht 63.0 in | Wt 312.0 lb

## 2024-07-07 DIAGNOSIS — E119 Type 2 diabetes mellitus without complications: Secondary | ICD-10-CM | POA: Diagnosis not present

## 2024-07-07 DIAGNOSIS — E785 Hyperlipidemia, unspecified: Secondary | ICD-10-CM | POA: Diagnosis not present

## 2024-07-07 DIAGNOSIS — Z6841 Body Mass Index (BMI) 40.0 and over, adult: Secondary | ICD-10-CM | POA: Diagnosis not present

## 2024-07-07 DIAGNOSIS — Z7985 Long-term (current) use of injectable non-insulin antidiabetic drugs: Secondary | ICD-10-CM

## 2024-07-07 DIAGNOSIS — E1169 Type 2 diabetes mellitus with other specified complication: Secondary | ICD-10-CM

## 2024-07-07 MED ORDER — TIRZEPATIDE 2.5 MG/0.5ML ~~LOC~~ SOAJ: 2.5000 mg | SUBCUTANEOUS | 0 refills | Status: DC

## 2024-07-07 NOTE — Progress Notes (Signed)
 WEIGHT SUMMARY AND BIOMETRICS  Weight Lost Since Last Visit: 2lb  Weight Gained Since Last Visit: 0   Vitals Temp: 97.7 F (36.5 C) BP: (!) 140/88 Pulse Rate: 75 SpO2: 98 %   Anthropometric Measurements Height: 5' 3 (1.6 m) Weight: (!) 312 lb (141.5 kg) BMI (Calculated): 55.28 Weight at Last Visit: 314lb Weight Lost Since Last Visit: 2lb Weight Gained Since Last Visit: 0 Starting Weight: 316lb Total Weight Loss (lbs): 4 lb (1.814 kg) Waist Measurement : 64 inches   Body Composition  Body Fat %: 52.8 % Fat Mass (lbs): 165 lbs Muscle Mass (lbs): 140 lbs Total Body Water (lbs): 101.4 lbs Visceral Fat Rating : 21   Other Clinical Data Fasting: no Labs: no Today's Visit #: 3 Starting Date: 05/21/24    OBESITY Ana Walker is here to discuss her progress with her obesity treatment plan along with follow-up of her obesity related diagnoses.    Nutrition Plan: the Category 3 plan - 80% adherence.  Current exercise: swimming  Interim History:  She is down 2 lbs since her last visit. Her fat mass is down 1.2 lbs per the bio-impedence scale. She still struggles with her carbohydrates.  Protein intake is as prescribed, Is not skipping meals, and Water intake is inadequate.   Pharmacotherapy: Ana Walker is on Metformin 500 mg TID Adverse side effects: None Hunger is moderately controlled.  Cravings are moderately controlled.  Assessment/Plan:    Type II Diabetes HgbA1c is at goal. Last A1c was 6.4 CBGs: Not checking      Episodes of hypoglycemia: no Medication(s): Had been on Ozempic up to 0.5 mg last year from May 2024 to February 2025 but had severe constipation and stopped. The GLP-1 did help with her appetite and food noise.   No results found for: HGBA1C Lab Results  Component Value Date   LDLCALC 180 (H) 05/21/2024   CREATININE 0.68  05/21/2024   No results found for: GFR  Ana Walker denies personal or family history of thyroid  cancer, history of pancreatitis, or current cholelithiasis. Ana Walker was informed of the most common side effects (nausea, constipation, diarrhea). She was given GLP-1 information sheet.  Patient informed to watch for possible symptoms, such as a lump or swelling in the neck, hoarseness, trouble swallowing, or shortness of breath. If you have any of these symptoms, tell your healthcare provide.   She has been placed on a 500 calorie deficit diet.  She has been advised to exercise at least 150 minutes per week, both cardio and resistance.    Plan: Start Mounjaro 2.5 mg SQ weekly Continue all other medications (Metformin).  Will keep all carbohydrates low both sweets and starches.  Will continue exercise regimen to 30 to 60 minutes on most days of the week.  Aim for 7 to 9 hours of sleep nightly.  Eat more low glycemic index foods.  She  will continue with her journaling at stay with her 1,500 calories and 90 to 110 grams of protein.   Hyperlipidemia LDL is at goal. Medication(s): Crestor  Cardiovascular risk factors: dyslipidemia, obesity (BMI >= 30 kg/m2), and sedentary lifestyle  Lab Results  Component Value Date   CHOL 222 (H) 05/21/2024   HDL 28 (L) 05/21/2024   LDLCALC 180 (H) 05/21/2024   TRIG 79 05/21/2024   Lab Results  Component Value Date   ALT 33 (H) 05/21/2024   AST 22 05/21/2024   ALKPHOS 75 05/21/2024   BILITOT 0.5 05/21/2024   The 10-year ASCVD risk score (Arnett DK, et al., 2019) is: 7.2%   Values used to calculate the score:     Age: 48 years     Clincally relevant sex: Female     Is Non-Hispanic African American: No     Diabetic: Yes     Tobacco smoker: No     Systolic Blood Pressure: 140 mmHg     Is BP treated: No     HDL Cholesterol: 28 mg/dL     Total Cholesterol: 222 mg/dL  Plan:  Continue statin.  Will avoid all trans fats.  Will read labels Will  minimize saturated fats except the following: low fat meats in moderation, diary, and limited dark chocolate.  Increase Omega 3 in foods, and consider an Omega 3 supplement.     Morbid Obesity: Current BMI BMI (Calculated): 55.28   Pharmacotherapy Plan Start  Mounjaro 2.5 mg SQ weekly  Ana Walker is currently in the action stage of change. As such, her goal is to continue with weight loss efforts.  She has agreed to the Category 3 plan.  Exercise goals: For substantial health benefits, adults should do at least 150 minutes (2 hours and 30 minutes) a week of moderate-intensity, or 75 minutes (1 hour and 15 minutes) a week of vigorous-intensity aerobic physical activity, or an equivalent combination of moderate- and vigorous-intensity aerobic activity. Aerobic activity should be performed in episodes of at least 10 minutes, and preferably, it should be spread throughout the week.  Behavioral modification strategies: increasing lean protein intake, decreasing simple carbohydrates , no meal skipping, decrease eating out, meal planning , increase water intake, better snacking choices, planning for success, keep healthy foods in the home, weigh protein portions, measure portion sizes, and mindful eating.  Ana Walker has agreed to follow-up with our clinic in 2 weeks.    Objective:   VITALS: Per patient if applicable, see vitals. GENERAL: Alert and in no acute distress. CARDIOPULMONARY: No increased WOB. Speaking in clear sentences.  PSYCH: Pleasant and cooperative. Speech normal rate and rhythm. Affect is appropriate. Insight and judgement are appropriate. Attention is focused, linear, and appropriate.  NEURO: Oriented as arrived to appointment on time with no prompting.   Attestation Statements:   This was prepared with the assistance of Engineer, civil (consulting).  Occasional wrong-word or sound-a-like substitutions may have occurred due to the inherent limitations of voice recognition   Clayborne Daring, DO

## 2024-07-09 ENCOUNTER — Ambulatory Visit: Payer: PRIVATE HEALTH INSURANCE | Admitting: Bariatrics

## 2024-07-10 ENCOUNTER — Telehealth: Payer: Self-pay | Admitting: Neurology

## 2024-07-10 NOTE — Telephone Encounter (Signed)
 LVM w/nurse Rebecca D about starting the process for the PA for the [facility]

## 2024-07-10 NOTE — Telephone Encounter (Signed)
 Split no auth req ref # [id number] D on 07/10/24

## 2024-07-17 ENCOUNTER — Other Ambulatory Visit: Payer: Self-pay | Admitting: Bariatrics

## 2024-07-19 ENCOUNTER — Other Ambulatory Visit: Payer: Self-pay | Admitting: Bariatrics

## 2024-07-22 ENCOUNTER — Encounter (HOSPITAL_COMMUNITY): Payer: Self-pay | Admitting: *Deleted

## 2024-07-22 ENCOUNTER — Ambulatory Visit: Payer: Self-pay | Admitting: Neurology

## 2024-07-23 ENCOUNTER — Ambulatory Visit: Payer: PRIVATE HEALTH INSURANCE | Admitting: Bariatrics

## 2024-07-24 ENCOUNTER — Other Ambulatory Visit: Payer: Self-pay | Admitting: Bariatrics
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
## 2024-07-30 ENCOUNTER — Ambulatory Visit: Payer: PRIVATE HEALTH INSURANCE | Attending: Family Medicine | Admitting: Physical Therapy

## 2024-07-30 ENCOUNTER — Encounter: Payer: Self-pay | Admitting: Physical Therapy

## 2024-07-30 DIAGNOSIS — M79672 Pain in left foot: Secondary | ICD-10-CM | POA: Diagnosis present

## 2024-07-30 DIAGNOSIS — M25562 Pain in left knee: Secondary | ICD-10-CM | POA: Diagnosis present

## 2024-07-30 DIAGNOSIS — R262 Difficulty in walking, not elsewhere classified: Secondary | ICD-10-CM | POA: Insufficient documentation

## 2024-07-30 DIAGNOSIS — M6281 Muscle weakness (generalized): Secondary | ICD-10-CM | POA: Insufficient documentation

## 2024-08-05 ENCOUNTER — Encounter: Payer: Self-pay | Admitting: Bariatrics

## 2024-08-05 ENCOUNTER — Ambulatory Visit: Payer: PRIVATE HEALTH INSURANCE | Admitting: Bariatrics

## 2024-08-05 VITALS — BP 137/93 | HR 71 | Temp 97.9°F | Ht 63.0 in | Wt 314.0 lb

## 2024-08-05 DIAGNOSIS — E559 Vitamin D deficiency, unspecified: Secondary | ICD-10-CM

## 2024-08-05 DIAGNOSIS — E66813 Obesity, class 3: Secondary | ICD-10-CM

## 2024-08-05 DIAGNOSIS — E785 Hyperlipidemia, unspecified: Secondary | ICD-10-CM

## 2024-08-05 DIAGNOSIS — Z6841 Body Mass Index (BMI) 40.0 and over, adult: Secondary | ICD-10-CM

## 2024-08-05 DIAGNOSIS — E119 Type 2 diabetes mellitus without complications: Secondary | ICD-10-CM

## 2024-08-05 DIAGNOSIS — E1169 Type 2 diabetes mellitus with other specified complication: Secondary | ICD-10-CM

## 2024-08-05 DIAGNOSIS — Z7985 Long-term (current) use of injectable non-insulin antidiabetic drugs: Secondary | ICD-10-CM

## 2024-08-05 MED ORDER — ROSUVASTATIN CALCIUM 20 MG PO TABS: 20.0000 mg | ORAL_TABLET | Freq: Every day | ORAL | 0 refills | Status: DC

## 2024-08-05 MED ORDER — TIRZEPATIDE 5 MG/0.5ML ~~LOC~~ SOAJ: 5.0000 mg | SUBCUTANEOUS | 0 refills | Status: DC

## 2024-08-05 MED ORDER — VITAMIN D (ERGOCALCIFEROL) 1.25 MG (50000 UNIT) PO CAPS: 50000.0000 [IU] | ORAL_CAPSULE | ORAL | 0 refills | Status: DC

## 2024-08-05 NOTE — Progress Notes (Signed)
 WEIGHT SUMMARY AND BIOMETRICS  Weight Lost Since Last Visit: 0  Weight Gained Since Last Visit: 2lb   Vitals Temp: 97.9 F (36.6 C) BP: (!) 137/93 Pulse Rate: 71 SpO2: 98 %   Anthropometric Measurements Height: 5' 3 (1.6 m) Weight: (!) 314 lb (142.4 kg) BMI (Calculated): 55.64 Weight at Last Visit: 312lb Weight Lost Since Last Visit: 0 Weight Gained Since Last Visit: 2lb Starting Weight: 316lb Total Weight Loss (lbs): 2 lb (0.907 kg)   Body Composition  Body Fat %: 54 % Fat Mass (lbs): 169.6 lbs Muscle Mass (lbs): 137.2 lbs Total Body Water (lbs): 101 lbs Visceral Fat Rating : 22   Other Clinical Data Fasting: no Labs: no Today's Visit #: 4 Starting Date: 05/21/24    OBESITY Ana Walker is here to discuss her progress with her obesity treatment plan along with follow-up of her obesity related diagnoses.    Nutrition Plan: the Category 3 plan - 75% adherence.  Current exercise: none  Interim History:  She is up 2 lbs and she wants to restart. She is going to [employer] and will need a scooter.  Eating all of the food on the plan., Protein intake is as prescribed, Is not skipping meals, and Water intake is adequate.   Pharmacotherapy: Ana Walker is on Mounjaro 2.5 mg SQ weekly Adverse side effects: None Hunger is moderately controlled.  Cravings are moderately controlled.  Assessment/Plan:   Type II Diabetes HgbA1c is at goal. Last A1c was 6.4 CBGs: Not checking      Episodes of hypoglycemia: no Medication(s): Mounjaro 2.5 mg SQ weekly  No results found for: HGBA1C Lab Results  Component Value Date   LDLCALC 180 (H) 05/21/2024   CREATININE 0.68 05/21/2024   No results found for: GFR  Plan: Continue and increase dose Mounjaro 5.0 mg SQ weekly Will keep all carbohydrates low both sweets and starches.  Will continue exercise regimen  to 30 to 60 minutes on most days of the week.  Aim for 7 to 9 hours of sleep nightly.  Eat more low glycemic index foods.  Will eat low sugar fruits and non-starchy vegetables.     Vitamin D  Insuffiency: Vitamin D  is at goal of 50.  Most recent vitamin D  level was 49.3. She is on  prescription ergocalciferol  50,000 IU weekly. Lab Results  Component Value Date   VD25OH 49.3 05/21/2024    Plan: Refill prescription vitamin D  50,000 IU weekly.  Will follow sleep hygiene.   Hyperlipidemia LDL is not at goal. Medication(s): Crestor Cardiovascular risk factors: diabetes mellitus, dyslipidemia, obesity (BMI >= 30 kg/m2), and sedentary lifestyle  Lab Results  Component Value Date   CHOL 222 (H) 05/21/2024   HDL 28 (L) 05/21/2024   LDLCALC 180 (H) 05/21/2024   TRIG 79 05/21/2024   Lab Results  Component Value Date   ALT 33 (H) 05/21/2024  AST 22 05/21/2024   ALKPHOS 75 05/21/2024   BILITOT 0.5 05/21/2024   The 10-year ASCVD risk score (Arnett DK, et al., 2019) is: 6.9%   Values used to calculate the score:     Age: 48 years     Clincally relevant sex: Female     Is Non-Hispanic African American: No     Diabetic: Yes     Tobacco smoker: No     Systolic Blood Pressure: 137 mmHg     Is BP treated: No     HDL Cholesterol: 28 mg/dL     Total Cholesterol: 222 mg/dL  Plan:  Continue statin.  Will avoid all trans fats.  Will read labels Will minimize saturated fats except the following: low fat meats in moderation, diary, and limited dark chocolate.  Will minimize fast-food.      Morbid Obesity: Current BMI BMI (Calculated): 55.64   Pharmacotherapy Plan Continue and increase dose  Mounjaro 5.0 mg SQ weekly  Ana Walker is currently in the action stage of change. As such, her goal is to continue with weight loss efforts.  She has agreed to the Category 3 plan.  Exercise goals: All adults should avoid inactivity. Some physical activity is better than none, and adults who  participate in any amount of physical activity gain some health benefits.She will continue to swim and she will use the elliptical.   Behavioral modification strategies: increasing lean protein intake, decreasing simple carbohydrates , no meal skipping, meal planning , increase water intake, better snacking choices, planning for success, avoiding temptations, keep healthy foods in the home, weigh protein portions, measure portion sizes, work on smaller portions, and mindful eating.  Ana Walker has agreed to follow-up with our clinic in 4 weeks.    Objective:   VITALS: Per patient if applicable, see vitals. GENERAL: Alert and in no acute distress. CARDIOPULMONARY: No increased WOB. Speaking in clear sentences.  PSYCH: Pleasant and cooperative. Speech normal rate and rhythm. Affect is appropriate. Insight and judgement are appropriate. Attention is focused, linear, and appropriate.  NEURO: Oriented as arrived to appointment on time with no prompting.   Attestation Statements:   This was prepared with the assistance of Engineer, Civil (consulting).  Occasional wrong-word or sound-a-like substitutions may have occurred due to the inherent limitations of voice recognition   Clayborne Daring, DO

## 2024-08-06 ENCOUNTER — Telehealth: Payer: Self-pay

## 2024-08-06 NOTE — Telephone Encounter (Signed)
 PA submitted through Cover [employer] Med[employer] for Mounjaro. Awaiting insurance determination. Key: Ana Walker

## 2024-08-07 NOTE — Telephone Encounter (Signed)
 PA for Mounjaro 5MG  has been approved. PA is now complete.

## 2024-08-12 ENCOUNTER — Other Ambulatory Visit (HOSPITAL_COMMUNITY): Payer: Self-pay | Admitting: Physician Assistant

## 2024-08-12 DIAGNOSIS — Z1231 Encounter for screening mammogram for malignant neoplasm of breast: Secondary | ICD-10-CM

## 2024-08-18 ENCOUNTER — Ambulatory Visit: Payer: PRIVATE HEALTH INSURANCE | Admitting: Physical Therapy

## 2024-08-22 ENCOUNTER — Ambulatory Visit (INDEPENDENT_AMBULATORY_CARE_PROVIDER_SITE_OTHER): Payer: PRIVATE HEALTH INSURANCE | Admitting: Neurology

## 2024-08-22 DIAGNOSIS — G4701 Insomnia due to medical condition: Secondary | ICD-10-CM

## 2024-08-22 DIAGNOSIS — F4310 Post-traumatic stress disorder, unspecified: Secondary | ICD-10-CM

## 2024-08-22 DIAGNOSIS — G4733 Obstructive sleep apnea (adult) (pediatric): Secondary | ICD-10-CM | POA: Diagnosis not present

## 2024-08-22 DIAGNOSIS — K760 Fatty (change of) liver, not elsewhere classified: Secondary | ICD-10-CM

## 2024-08-22 DIAGNOSIS — F411 Generalized anxiety disorder: Secondary | ICD-10-CM

## 2024-08-25 ENCOUNTER — Ambulatory Visit: Payer: PRIVATE HEALTH INSURANCE | Admitting: Physical Therapy

## 2024-08-25 ENCOUNTER — Encounter: Payer: Self-pay | Admitting: Physical Therapy

## 2024-08-25 ENCOUNTER — Ambulatory Visit (HOSPITAL_COMMUNITY)
Admission: RE | Admit: 2024-08-25 | Discharge: 2024-08-25 | Disposition: A | Payer: PRIVATE HEALTH INSURANCE | Source: Ambulatory Visit | Attending: Physician Assistant | Admitting: Physician Assistant

## 2024-08-25 DIAGNOSIS — M6281 Muscle weakness (generalized): Secondary | ICD-10-CM | POA: Insufficient documentation

## 2024-08-25 DIAGNOSIS — R262 Difficulty in walking, not elsewhere classified: Secondary | ICD-10-CM | POA: Insufficient documentation

## 2024-08-25 DIAGNOSIS — M79672 Pain in left foot: Secondary | ICD-10-CM | POA: Insufficient documentation

## 2024-08-25 DIAGNOSIS — Z1231 Encounter for screening mammogram for malignant neoplasm of breast: Secondary | ICD-10-CM | POA: Diagnosis present

## 2024-08-25 DIAGNOSIS — M25562 Pain in left knee: Secondary | ICD-10-CM | POA: Diagnosis present
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
## 2024-09-01 ENCOUNTER — Ambulatory Visit: Payer: Self-pay | Admitting: Neurology

## 2024-09-01 DIAGNOSIS — G4701 Insomnia due to medical condition: Secondary | ICD-10-CM | POA: Insufficient documentation
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
## 2024-09-02 ENCOUNTER — Ambulatory Visit: Payer: PRIVATE HEALTH INSURANCE | Admitting: Bariatrics

## 2024-09-03 ENCOUNTER — Ambulatory Visit: Payer: PRIVATE HEALTH INSURANCE | Admitting: Physical Therapy

## 2024-09-03 ENCOUNTER — Ambulatory Visit: Payer: PRIVATE HEALTH INSURANCE | Admitting: Bariatrics

## 2024-09-03 ENCOUNTER — Encounter: Payer: Self-pay | Admitting: Bariatrics

## 2024-09-03 VITALS — BP 133/84 | HR 80 | Ht 63.0 in | Wt 312.0 lb

## 2024-09-03 DIAGNOSIS — E119 Type 2 diabetes mellitus without complications: Secondary | ICD-10-CM | POA: Diagnosis not present

## 2024-09-03 DIAGNOSIS — Z7985 Long-term (current) use of injectable non-insulin antidiabetic drugs: Secondary | ICD-10-CM | POA: Diagnosis not present

## 2024-09-03 DIAGNOSIS — Z6841 Body Mass Index (BMI) 40.0 and over, adult: Secondary | ICD-10-CM

## 2024-09-03 DIAGNOSIS — E785 Hyperlipidemia, unspecified: Secondary | ICD-10-CM | POA: Diagnosis not present

## 2024-09-03 DIAGNOSIS — E1169 Type 2 diabetes mellitus with other specified complication: Secondary | ICD-10-CM

## 2024-09-03 DIAGNOSIS — E559 Vitamin D deficiency, unspecified: Secondary | ICD-10-CM | POA: Diagnosis not present

## 2024-09-03 MED ORDER — VITAMIN D (ERGOCALCIFEROL) 1.25 MG (50000 UNIT) PO CAPS: 50000.0000 [IU] | ORAL_CAPSULE | ORAL | 0 refills | Status: AC

## 2024-09-03 MED ORDER — ROSUVASTATIN CALCIUM 20 MG PO TABS: 20.0000 mg | ORAL_TABLET | Freq: Every day | ORAL | 0 refills | Status: AC

## 2024-09-03 MED ORDER — TIRZEPATIDE 5 MG/0.5ML ~~LOC~~ SOAJ: 5.0000 mg | SUBCUTANEOUS | 0 refills | Status: AC

## 2024-09-03 NOTE — Progress Notes (Signed)
 WEIGHT SUMMARY AND BIOMETRICS  Weight Lost Since Last Visit: 2lb  Weight Gained Since Last Visit: 0   Vitals BP: 133/84 Pulse Rate: 80 SpO2: 99 %   Anthropometric Measurements Height: 5' 3 (1.6 m) Weight: (!) 312 lb (141.5 kg) BMI (Calculated): 55.28 Weight at Last Visit: 314lb Weight Lost Since Last Visit: 2lb Weight Gained Since Last Visit: 0 Starting Weight: 316lb Total Weight Loss (Walker): 4 lb (1.814 kg)   Body Composition  Body Fat %: 53.4 % Fat Mass (Walker): 166.6 Walker Muscle Mass (Walker): 138 Walker Total Body Water (Walker): 101.2 Walker Visceral Fat Rating : 21   Other Clinical Data Fasting: no Labs: no Today's Visit #: 5 Starting Date: 05/21/24    OBESITY Ana Walker with her obesity treatment plan along with follow-up of her obesity related diagnoses.    Nutrition Plan: the Category 3 plan - 50% adherence.  Current exercise: sit down elliptical   Interim History:  She Walker down 2 Walker since her last visit.  Eating all of the food on the plan., Protein intake Walker as prescribed, Walker skipping meals, and Water intake Walker adequate.   Pharmacotherapy: Ana Walker Walker on Mounjaro  5.0 mg SQ weekly Adverse side effects: None Hunger Walker moderately controlled.  Cravings are moderately controlled.  Assessment/Plan:   Type II Diabetes HgbA1c was not at goal.  Episodes of hypoglycemia: no Medication(s): Mounjaro  5.0 mg SQ weekly  No results found for: HGBA1C Lab Results  Component Value Date   LDLCALC 180 (H) 05/21/2024   CREATININE 0.68 05/21/2024   No results found for: GFR  Plan: Continue and refill Mounjaro  5.0 mg SQ weekly Continue all other medications.  Will keep all carbohydrates low both sweets and starches.  Will continue exercise regimen to 30 to 60 minutes on most days of the week.  Aim for 7 to 9 hours of  sleep nightly.  Eat more low glycemic index foods.   Hyperlipidemia LDL Walker not at goal. Medication(s): Crestor  20 mg Cardiovascular risk factors: dyslipidemia, obesity (BMI >= 30 kg/m2), and sedentary lifestyle  Lab Results  Component Value Date   CHOL 222 (H) 05/21/2024   HDL 28 (L) 05/21/2024   LDLCALC 180 (H) 05/21/2024   TRIG 79 05/21/2024   Lab Results  Component Value Date   ALT 33 (H) 05/21/2024   AST 22 05/21/2024   ALKPHOS 75 05/21/2024   BILITOT 0.5 05/21/2024   The 10-year ASCVD risk score (Arnett DK, et al., 2019) Walker: 6.8%   Values used to calculate the score:     Age: 48 years     Clincally relevant sex: Female     Walker Non-Hispanic African American: No     Diabetic: Yes     Tobacco smoker: No     Systolic Blood Pressure: 133 mmHg     Walker BP  treated: No     HDL Cholesterol: 28 mg/dL     Total Cholesterol: 222 mg/dL  Plan:  Continue statin.  Information sheet on healthy vs unhealthy fats.  Will avoid all trans fats.  Will read labels Will minimize saturated fats except the following: low fat meats in moderation, diary, and limited dark chocolate.  [facility]   Vitamin D  Deficiency Vitamin D  Walker not at goal of 50.  Most recent vitamin D  level was 49.3. She Walker on  prescription ergocalciferol  50,000 IU weekly. Lab Results  Component Value Date   VD25OH 49.3 05/21/2024    Plan: Refill prescription vitamin D  50,000 IU weekly.      Morbid Obesity: Current BMI BMI (Calculated): 55.28   Pharmacotherapy Plan Continue and refill  Mounjaro  5.0 mg SQ weekly  Ana Walker of change. As such, her goal Walker to continue with weight loss efforts.  She has agreed to the Category 3 plan.  Exercise goals: All adults should avoid inactivity. Some physical activity Walker better than none, and adults who participate in any amount of physical activity gain some health benefits.  Behavioral modification strategies:  increasing lean protein intake, no meal skipping, meal planning , increase water intake, better snacking choices, planning for success, increasing vegetables, ways to avoid night time snacking, keep healthy foods in the home, weigh protein portions, measure portion sizes, and mindful eating.  Ana Walker has agreed to follow-up with our clinic in 4 weeks.      Objective:   VITALS: Per patient if applicable, see vitals. GENERAL: Alert and in no acute distress. CARDIOPULMONARY: No increased WOB. Speaking in clear sentences.  PSYCH: Pleasant and cooperative. Speech normal rate and rhythm. Affect Walker appropriate. Insight and judgement are appropriate. Attention Walker focused, linear, and appropriate.  NEURO: Oriented as arrived to appointment on time with no prompting.   Attestation Statements:   This was prepared with the assistance of Engineer, Civil (consulting).  Occasional wrong-word or sound-a-like substitutions may have occurred due to the inherent limitations of voice recognition   Ana Daring, DO

## 2024-09-04 NOTE — Telephone Encounter (Signed)
 Spoke to  patient aware sent order to [employer] this afternoon Pt aware due to AHI 5 insurance may not approve new cpap machine

## 2024-09-15 NOTE — Telephone Encounter (Signed)
 Received fax from [employer] stating insurance didn't approve cpap machine due to AHI 5 Per DME Pt aware . Will forward to Dr. Chalice

## 2024-09-29 ENCOUNTER — Ambulatory Visit: Payer: PRIVATE HEALTH INSURANCE | Admitting: Bariatrics

## 2024-10-09 ENCOUNTER — Other Ambulatory Visit: Payer: Self-pay | Admitting: Bariatrics

## 2024-10-13 ENCOUNTER — Ambulatory Visit: Payer: PRIVATE HEALTH INSURANCE | Admitting: Physical Therapy
